# Patient Record
Sex: Female | Born: 1960 | Race: White | Hispanic: No | Smoking: Former smoker
Health system: Southern US, Community
[De-identification: ages and names within clinical notes are randomized; demographics above are authoritative.]

## PROBLEM LIST (undated history)

## (undated) DIAGNOSIS — T7840XA Allergy, unspecified, initial encounter: Secondary | ICD-10-CM

## (undated) DIAGNOSIS — L570 Actinic keratosis: Secondary | ICD-10-CM

## (undated) DIAGNOSIS — M1990 Unspecified osteoarthritis, unspecified site: Secondary | ICD-10-CM

## (undated) DIAGNOSIS — E119 Type 2 diabetes mellitus without complications: Secondary | ICD-10-CM

## (undated) DIAGNOSIS — G629 Polyneuropathy, unspecified: Secondary | ICD-10-CM

## (undated) HISTORY — DX: Polyneuropathy, unspecified: G62.9

## (undated) HISTORY — PX: BREAST SURGERY: SHX581

## (undated) HISTORY — DX: Type 2 diabetes mellitus without complications: E11.9

## (undated) HISTORY — PX: CHOLECYSTECTOMY: SHX55

## (undated) HISTORY — DX: Actinic keratosis: L57.0

## (undated) HISTORY — DX: Allergy, unspecified, initial encounter: T78.40XA

## (undated) HISTORY — DX: Unspecified osteoarthritis, unspecified site: M19.90

## (undated) HISTORY — PX: GALLBLADDER SURGERY: SHX652

---

## 1986-05-23 HISTORY — PX: REDUCTION MAMMAPLASTY: SUR839

## 2004-11-03 ENCOUNTER — Ambulatory Visit: Payer: Self-pay | Admitting: Obstetrics and Gynecology

## 2006-08-01 ENCOUNTER — Ambulatory Visit: Payer: Self-pay | Admitting: Gastroenterology

## 2006-08-01 ENCOUNTER — Ambulatory Visit: Payer: Self-pay | Admitting: Obstetrics and Gynecology

## 2006-08-09 ENCOUNTER — Ambulatory Visit: Payer: Self-pay | Admitting: Gastroenterology

## 2007-12-19 ENCOUNTER — Ambulatory Visit: Payer: Self-pay | Admitting: Obstetrics and Gynecology

## 2009-09-29 ENCOUNTER — Ambulatory Visit: Payer: Self-pay | Admitting: Obstetrics and Gynecology

## 2010-10-11 ENCOUNTER — Ambulatory Visit: Payer: Self-pay | Admitting: Obstetrics and Gynecology

## 2011-12-07 ENCOUNTER — Ambulatory Visit: Payer: Self-pay | Admitting: Obstetrics and Gynecology

## 2013-01-09 ENCOUNTER — Ambulatory Visit: Payer: Self-pay | Admitting: Obstetrics and Gynecology

## 2013-09-27 ENCOUNTER — Ambulatory Visit: Payer: BC Managed Care – PPO | Admitting: Podiatry

## 2013-09-27 ENCOUNTER — Ambulatory Visit (INDEPENDENT_AMBULATORY_CARE_PROVIDER_SITE_OTHER): Payer: BC Managed Care – PPO

## 2013-09-27 ENCOUNTER — Encounter: Payer: Self-pay | Admitting: Podiatry

## 2013-09-27 VITALS — BP 121/87 | HR 86 | Resp 16 | Ht 69.0 in | Wt 181.0 lb

## 2013-09-27 DIAGNOSIS — M79609 Pain in unspecified limb: Secondary | ICD-10-CM

## 2013-09-27 DIAGNOSIS — Z794 Long term (current) use of insulin: Principal | ICD-10-CM

## 2013-09-27 DIAGNOSIS — M069 Rheumatoid arthritis, unspecified: Secondary | ICD-10-CM

## 2013-09-27 DIAGNOSIS — M7750 Other enthesopathy of unspecified foot: Secondary | ICD-10-CM

## 2013-09-27 DIAGNOSIS — E119 Type 2 diabetes mellitus without complications: Secondary | ICD-10-CM

## 2013-09-27 MED ORDER — TRIAMCINOLONE ACETONIDE 10 MG/ML IJ SUSP
10.0000 mg | Freq: Once | INTRAMUSCULAR | Status: AC
  Administered 2013-09-27: 10 mg

## 2013-09-27 NOTE — Progress Notes (Signed)
   Subjective:    Patient ID: Pam Quinn, female    DOB: [birth year], 53 y.o.   MRN: [id number]  HPI Comments: Just wanted to have my inserts checked, see if  I need new ones. Feet have been feeling worse. Last A1c was 6.2      Review of Systems  Endocrine:       Diabetes   Allergic/Immunologic: Positive for environmental allergies.  Neurological: Positive for numbness.       Objective:   Physical Exam        Assessment & Plan:

## 2013-09-29 NOTE — Progress Notes (Signed)
Subjective:     Patient ID: Pam Quinn, female   DOB: 05-18-1961, 53 y.o.   MRN: [id number]  HPI patient presents stating I'm getting a lot of pain on top of my feet and my arches get tired and make it hard for me to walk distance. My arthritis has been relatively stable   Review of Systems  All other systems reviewed and are negative.      Objective:   Physical Exam  Nursing note and vitals reviewed. Constitutional: Pam Quinn is oriented to person, place, and time.  Cardiovascular: Intact distal pulses.   Musculoskeletal: Normal range of motion.  Neurological: Pam Quinn is oriented to person, place, and time.  Skin: Skin is warm.   neurovascular status intact with health history unchanged and range of motion subtalar midtarsal joint adequate. 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

## 2013-10-10 ENCOUNTER — Encounter: Payer: Self-pay | Admitting: *Deleted

## 2013-10-10 NOTE — Progress Notes (Signed)
Sent pt post card letting her know orthotics are here. 

## 2013-10-22 ENCOUNTER — Other Ambulatory Visit: Payer: BC Managed Care – PPO

## 2013-10-29 ENCOUNTER — Ambulatory Visit (INDEPENDENT_AMBULATORY_CARE_PROVIDER_SITE_OTHER): Payer: BC Managed Care – PPO | Admitting: *Deleted

## 2013-10-29 ENCOUNTER — Encounter: Payer: Self-pay | Admitting: *Deleted

## 2013-10-29 VITALS — BP 125/76 | HR 94 | Resp 16

## 2013-10-29 DIAGNOSIS — M7750 Other enthesopathy of unspecified foot: Secondary | ICD-10-CM

## 2013-10-29 NOTE — Progress Notes (Signed)
Pt presents today to pick up orthotics and went over wearing instructions.

## 2013-10-29 NOTE — Patient Instructions (Signed)

## 2014-04-25 ENCOUNTER — Ambulatory Visit (INDEPENDENT_AMBULATORY_CARE_PROVIDER_SITE_OTHER): Payer: BC Managed Care – PPO | Admitting: Podiatry

## 2014-04-25 VITALS — BP 147/91 | HR 75 | Resp 16

## 2014-04-25 DIAGNOSIS — M779 Enthesopathy, unspecified: Secondary | ICD-10-CM

## 2014-04-25 MED ORDER — TRIAMCINOLONE ACETONIDE 10 MG/ML IJ SUSP
10.0000 mg | Freq: Once | INTRAMUSCULAR | Status: AC
  Administered 2014-04-25: 10 mg

## 2014-04-27 NOTE — Progress Notes (Signed)
Subjective:     Patient ID: Pam Quinn, female   DOB: February 11, 1961, 53 y.o.   MRN: [id number]  HPI patient points stating she's getting pain on top of her feet bilateral and that she had about 5-6 months of relief   Review of Systems     Objective:   Physical Exam Neurovascular status intact with continued discomfort in the dorsum of both feet within the midtarsal joint consistent with arthritis    Assessment:     Tendinitis with probable deep inflammatory arthritis noted    Plan:     Reviewed home physical therapy and shoe gear appropriate usage for her and today injected the dorsal tendon complex of both feet 3 mg Kenalog 5 mg Xylocaine and advised on heat and ice therapy

## 2014-07-31 DIAGNOSIS — M7750 Other enthesopathy of unspecified foot: Secondary | ICD-10-CM

## 2014-09-05 ENCOUNTER — Ambulatory Visit (INDEPENDENT_AMBULATORY_CARE_PROVIDER_SITE_OTHER): Payer: BLUE CROSS/BLUE SHIELD | Admitting: Podiatry

## 2014-09-05 ENCOUNTER — Encounter: Payer: Self-pay | Admitting: Podiatry

## 2014-09-05 VITALS — BP 141/83 | HR 91 | Resp 16

## 2014-09-05 DIAGNOSIS — M779 Enthesopathy, unspecified: Secondary | ICD-10-CM | POA: Diagnosis not present

## 2014-09-08 NOTE — Progress Notes (Signed)
Subjective:     Patient ID: Pam Quinn, female   DOB: 12/30/1960, 54 y.o.   MRN: [id number]  HPI patient states that her feet still bother her at times and the orthotics are starting to lose some of their support and they do not fit all shoes   Review of Systems     Objective:   Physical Exam Neurovascular status intact muscle strength adequate with moderate depression of the arch and tendinitis-like symptoms    Assessment:     Tendinitis bilateral with structural deformity    Plan:     Advised on physical therapy anti-inflammatories and continued orthotic usage and scanned for new orthotics. Reappoint when ready

## 2014-09-10 ENCOUNTER — Other Ambulatory Visit: Payer: Self-pay | Admitting: Obstetrics and Gynecology

## 2014-09-10 DIAGNOSIS — Z1231 Encounter for screening mammogram for malignant neoplasm of breast: Secondary | ICD-10-CM

## 2014-10-08 ENCOUNTER — Ambulatory Visit: Payer: Self-pay

## 2014-10-24 ENCOUNTER — Ambulatory Visit (INDEPENDENT_AMBULATORY_CARE_PROVIDER_SITE_OTHER): Payer: BLUE CROSS/BLUE SHIELD | Admitting: Podiatry

## 2014-10-24 ENCOUNTER — Ambulatory Visit: Payer: BC Managed Care – PPO | Admitting: Podiatry

## 2014-10-24 VITALS — BP 129/80 | HR 82 | Resp 16

## 2014-10-24 DIAGNOSIS — M779 Enthesopathy, unspecified: Secondary | ICD-10-CM | POA: Diagnosis not present

## 2014-10-24 MED ORDER — TRIAMCINOLONE ACETONIDE 10 MG/ML IJ SUSP
10.0000 mg | Freq: Once | INTRAMUSCULAR | Status: AC
  Administered 2014-10-24: 10 mg

## 2014-10-24 NOTE — Patient Instructions (Signed)

## 2014-10-24 NOTE — Progress Notes (Signed)
Subjective:     Patient ID: Pam Quinn, female   DOB: 10-28-1960, 54 y.o.   MRN: [id number]  HPI patient states my father passed away so I was on my feet a lot and the tops have started to hurt me quite a bit   Review of Systems     Objective:   Physical Exam Neurovascular status intact with inflammation of the lateral side of the mid tarsal joint bilateral with inflammation and fluid buildup noted    Assessment:     Tendinitis bilateral with inflammation and fluid buildup    Plan:     Injected the lateral side of the midtarsal joint 3 mg Kenalog 5 mg Xylocaine and instructed on heat ice therapy and dispensed orthotics with instructions and reappoint as needed

## 2014-10-29 ENCOUNTER — Ambulatory Visit
Admission: RE | Admit: 2014-10-29 | Discharge: 2014-10-29 | Disposition: A | Discharge status: Home / Self Care | Payer: BLUE CROSS/BLUE SHIELD | Source: Ambulatory Visit | Attending: Obstetrics and Gynecology | Admitting: Obstetrics and Gynecology

## 2014-10-29 DIAGNOSIS — Z1231 Encounter for screening mammogram for malignant neoplasm of breast: Secondary | ICD-10-CM | POA: Diagnosis not present

## 2015-01-19 ENCOUNTER — Other Ambulatory Visit: Payer: Self-pay

## 2015-01-19 DIAGNOSIS — J449 Chronic obstructive pulmonary disease, unspecified: Secondary | ICD-10-CM

## 2015-01-19 DIAGNOSIS — I158 Other secondary hypertension: Secondary | ICD-10-CM

## 2015-01-19 DIAGNOSIS — R0602 Shortness of breath: Secondary | ICD-10-CM

## 2015-01-22 ENCOUNTER — Ambulatory Visit: Payer: BLUE CROSS/BLUE SHIELD

## 2015-01-22 DIAGNOSIS — R0602 Shortness of breath: Secondary | ICD-10-CM

## 2015-01-22 DIAGNOSIS — J449 Chronic obstructive pulmonary disease, unspecified: Secondary | ICD-10-CM

## 2015-01-22 DIAGNOSIS — I158 Other secondary hypertension: Secondary | ICD-10-CM | POA: Diagnosis not present

## 2015-03-05 ENCOUNTER — Ambulatory Visit (INDEPENDENT_AMBULATORY_CARE_PROVIDER_SITE_OTHER): Payer: BLUE CROSS/BLUE SHIELD

## 2015-03-05 ENCOUNTER — Encounter: Payer: Self-pay | Admitting: Podiatry

## 2015-03-05 ENCOUNTER — Ambulatory Visit (INDEPENDENT_AMBULATORY_CARE_PROVIDER_SITE_OTHER): Payer: BLUE CROSS/BLUE SHIELD | Admitting: Podiatry

## 2015-03-05 VITALS — BP 135/97 | HR 91 | Resp 16

## 2015-03-05 DIAGNOSIS — M779 Enthesopathy, unspecified: Secondary | ICD-10-CM

## 2015-03-05 DIAGNOSIS — M722 Plantar fascial fibromatosis: Secondary | ICD-10-CM

## 2015-03-05 MED ORDER — TRIAMCINOLONE ACETONIDE 10 MG/ML IJ SUSP
10.0000 mg | Freq: Once | INTRAMUSCULAR | Status: AC
  Administered 2015-03-05: 10 mg

## 2015-03-06 NOTE — Progress Notes (Signed)
Subjective:     Patient ID: Pam Quinn, female   DOB: Jan 12, 1961, 54 y.o.   MRN: [id number]  HPI patient states I'm getting pain on the top of both my feet that's not as intense but I want to try to catch it before it gets worse and I need new orthotics for my feet   Review of Systems     Objective:   Physical Exam Neurovascular status intact muscle strength adequate range of motion within normal limits with patient noted to have exquisite discomfort dorsal aspect midtarsal joint bilateral with inflammatory tendinitis noted    Assessment:     Tendinitis with inflammation of the dorsal midfoot area bilateral    Plan:     H&P and condition reviewed with patient and at this time I did injection treatment of the dorsal midfoot bilateral 3 mg Kenalog 5 mg Xylocaine and then scanned for custom orthotics to reduce plantar pressure against the feet

## 2015-03-26 ENCOUNTER — Ambulatory Visit: Payer: BLUE CROSS/BLUE SHIELD | Admitting: *Deleted

## 2015-03-26 DIAGNOSIS — M779 Enthesopathy, unspecified: Secondary | ICD-10-CM

## 2015-03-26 NOTE — Progress Notes (Signed)
Patient ID: Pam Quinn, female   DOB: [birth year], 54 y.o.   MRN: [id number] Patient presents for orthotic pick up.  Verbal and written break in and wear instructions given.  Patient will follow up in 4 weeks if symptoms worsen or fail to improve.

## 2015-03-26 NOTE — Patient Instructions (Signed)

## 2015-07-29 ENCOUNTER — Encounter: Payer: Self-pay | Admitting: Podiatry

## 2015-07-29 ENCOUNTER — Ambulatory Visit (INDEPENDENT_AMBULATORY_CARE_PROVIDER_SITE_OTHER): Payer: BLUE CROSS/BLUE SHIELD | Admitting: Podiatry

## 2015-07-29 DIAGNOSIS — M779 Enthesopathy, unspecified: Secondary | ICD-10-CM

## 2015-07-29 DIAGNOSIS — Z794 Long term (current) use of insulin: Secondary | ICD-10-CM

## 2015-07-29 DIAGNOSIS — E119 Type 2 diabetes mellitus without complications: Secondary | ICD-10-CM

## 2015-07-29 MED ORDER — TRIAMCINOLONE ACETONIDE 10 MG/ML IJ SUSP
10.0000 mg | Freq: Once | INTRAMUSCULAR | Status: AC
  Administered 2015-07-29: 10 mg

## 2015-07-30 NOTE — Progress Notes (Signed)
Subjective:     Patient ID: Pam Quinn, female   DOB: Sep 18, 1960, 55 y.o.   MRN: [id number]  HPI patient states that both midfoot areas are bothering me and making it hard to walk and I need medication but so far it's been doing a good job of controlling my symptoms   Review of Systems     Objective:   Physical Exam Neurovascular status intact muscle strength adequate with inflammation around the midtarsal joint bilateral with fluid buildup noted.    Assessment:     Tendinitis of the dorsal midfoot area bilateral    Plan:     Injected the dorsal midfoot area bilateral 3 mg Kenalog 5 mill grams Xylocaine and advised on heat therapy shoes that do not press against the area and the possibilities long-term for topical or other treatments for not able to continue to keep symptoms good

## 2015-12-01 ENCOUNTER — Encounter: Payer: Self-pay | Admitting: Podiatry

## 2015-12-01 NOTE — Progress Notes (Signed)
Mailed medical records today to the [employer] per request of attorney Danne Harbor. TFC JMS

## 2016-02-26 ENCOUNTER — Encounter: Payer: Self-pay | Admitting: Podiatry

## 2016-02-26 ENCOUNTER — Ambulatory Visit (INDEPENDENT_AMBULATORY_CARE_PROVIDER_SITE_OTHER): Payer: BLUE CROSS/BLUE SHIELD | Admitting: Podiatry

## 2016-02-26 ENCOUNTER — Ambulatory Visit (INDEPENDENT_AMBULATORY_CARE_PROVIDER_SITE_OTHER): Payer: BLUE CROSS/BLUE SHIELD

## 2016-02-26 VITALS — BP 165/98 | HR 81 | Resp 16 | Ht 68.0 in | Wt 185.0 lb

## 2016-02-26 DIAGNOSIS — M779 Enthesopathy, unspecified: Secondary | ICD-10-CM | POA: Diagnosis not present

## 2016-02-26 DIAGNOSIS — M79671 Pain in right foot: Secondary | ICD-10-CM

## 2016-02-26 DIAGNOSIS — M79672 Pain in left foot: Secondary | ICD-10-CM | POA: Diagnosis not present

## 2016-02-26 MED ORDER — TRIAMCINOLONE ACETONIDE 10 MG/ML IJ SUSP
10.0000 mg | Freq: Once | INTRAMUSCULAR | Status: AC
  Administered 2016-02-26: 10 mg

## 2016-02-28 NOTE — Progress Notes (Signed)
Subjective:     Patient ID: Pam Quinn, female   DOB: 1960/09/01, 55 y.o.   MRN: [id number]  HPI patient states that she still getting pain on top of her feet and she needs new orthotics   Review of Systems     Objective:   Physical Exam Neurovascular status intact with continued inflammation and pain of the dorsum of both feet with fluid buildup noted and depression of the arch    Assessment:     Tendinitis-like condition with pain around the dorsum of the metatarsals bilateral with fluid buildup    Plan:     H&P conditions reviewed and today dorsal injections administered 3 mg Kenalog 5 mg Xylocaine and advised on orthotics which were scanned for today

## 2016-03-18 ENCOUNTER — Ambulatory Visit (INDEPENDENT_AMBULATORY_CARE_PROVIDER_SITE_OTHER): Payer: BLUE CROSS/BLUE SHIELD | Admitting: Podiatry

## 2016-03-18 DIAGNOSIS — M779 Enthesopathy, unspecified: Secondary | ICD-10-CM

## 2016-03-18 NOTE — Patient Instructions (Signed)

## 2016-03-18 NOTE — Progress Notes (Signed)
Patient ID: Pam Quinn, female   DOB: Jun 04, 1960, 55 y.o.   MRN: [id number]  Patient presents for orthotic pick up.  Verbal and written break in and wear instructions given.  Patient will follow up in 4 weeks if symptoms worsen or fail to improve.

## 2016-03-23 ENCOUNTER — Other Ambulatory Visit: Payer: Self-pay | Admitting: Obstetrics and Gynecology

## 2016-03-23 DIAGNOSIS — Z1231 Encounter for screening mammogram for malignant neoplasm of breast: Secondary | ICD-10-CM

## 2016-04-28 ENCOUNTER — Ambulatory Visit: Payer: BLUE CROSS/BLUE SHIELD

## 2016-05-02 ENCOUNTER — Ambulatory Visit
Admission: RE | Admit: 2016-05-02 | Discharge: 2016-05-02 | Disposition: A | Discharge status: Home / Self Care | Payer: BLUE CROSS/BLUE SHIELD | Source: Ambulatory Visit | Attending: Obstetrics and Gynecology | Admitting: Obstetrics and Gynecology

## 2016-05-02 DIAGNOSIS — Z1231 Encounter for screening mammogram for malignant neoplasm of breast: Secondary | ICD-10-CM | POA: Insufficient documentation

## 2016-09-01 ENCOUNTER — Ambulatory Visit (INDEPENDENT_AMBULATORY_CARE_PROVIDER_SITE_OTHER): Payer: BLUE CROSS/BLUE SHIELD | Admitting: Podiatry

## 2016-09-01 ENCOUNTER — Encounter: Payer: Self-pay | Admitting: Podiatry

## 2016-09-01 DIAGNOSIS — M79672 Pain in left foot: Secondary | ICD-10-CM

## 2016-09-01 DIAGNOSIS — M779 Enthesopathy, unspecified: Secondary | ICD-10-CM | POA: Diagnosis not present

## 2016-09-01 DIAGNOSIS — M79671 Pain in right foot: Secondary | ICD-10-CM

## 2016-09-01 MED ORDER — TRIAMCINOLONE ACETONIDE 10 MG/ML IJ SUSP
10.0000 mg | Freq: Once | INTRAMUSCULAR | Status: AC
  Administered 2016-09-01: 10 mg

## 2016-09-04 NOTE — Progress Notes (Signed)
Subjective:     Patient ID: Pam Quinn, female   DOB: March 17, 1961, 56 y.o.   MRN: [id number]  HPI patient presents stating that she's had a lot of pain in the dorsum of both feet that occurred recently and she has inflammation of the tendon complex   Review of Systems     Objective:   Physical Exam Neurovascular status unchanged with tendinitis lateral side both feet with inflammation and fluid buildup with moderate depression of the arch    Assessment:     Inflammatory tendinitis bilateral    Plan:     H&P condition reviewed foot structure discussed. At this point I recommended new orthotics and patient is scanned and I did inject the dorsal lateral tendon complex bilateral 3 mg Kenalog 5 mill grams Xylocaine and advised on heat and ice therapy. Reappoint when orthotics are ready or earlier if needed

## 2016-09-23 ENCOUNTER — Ambulatory Visit (INDEPENDENT_AMBULATORY_CARE_PROVIDER_SITE_OTHER): Payer: Self-pay | Admitting: Podiatry

## 2016-09-23 DIAGNOSIS — M79671 Pain in right foot: Secondary | ICD-10-CM

## 2016-09-23 DIAGNOSIS — M79672 Pain in left foot: Secondary | ICD-10-CM

## 2016-09-23 NOTE — Progress Notes (Signed)
Patient ID: Pam Quinn, female   DOB: [birth year], 56 y.o.   MRN: [id number]   Patient presents today to pick up her custom molded orthotics. Break-in and care instructions were provided. All patient questions were answered. Return to clinic in 4 weeks for follow-up.  Felecia Shelling, DPM [facility]  Dr. Felecia Shelling, DPM    [address]                                        [place], [place] [address]                Office [phone]  Fax [phone]

## 2017-05-01 ENCOUNTER — Ambulatory Visit: Payer: BLUE CROSS/BLUE SHIELD | Admitting: Podiatry

## 2017-05-05 ENCOUNTER — Ambulatory Visit (INDEPENDENT_AMBULATORY_CARE_PROVIDER_SITE_OTHER): Payer: BLUE CROSS/BLUE SHIELD | Admitting: Podiatry

## 2017-05-05 ENCOUNTER — Encounter: Payer: Self-pay | Admitting: Podiatry

## 2017-05-05 ENCOUNTER — Ambulatory Visit (INDEPENDENT_AMBULATORY_CARE_PROVIDER_SITE_OTHER): Payer: BLUE CROSS/BLUE SHIELD

## 2017-05-05 DIAGNOSIS — M7661 Achilles tendinitis, right leg: Secondary | ICD-10-CM | POA: Diagnosis not present

## 2017-05-05 DIAGNOSIS — M7662 Achilles tendinitis, left leg: Secondary | ICD-10-CM

## 2017-05-05 DIAGNOSIS — M779 Enthesopathy, unspecified: Secondary | ICD-10-CM

## 2017-05-05 MED ORDER — TRIAMCINOLONE ACETONIDE 10 MG/ML IJ SUSP
10.0000 mg | Freq: Once | INTRAMUSCULAR | Status: AC
  Administered 2017-05-05: 10 mg

## 2017-05-06 NOTE — Progress Notes (Signed)
Subjective:   Patient ID: Pam Quinn, female   DOB: 56 y.o.   MRN: [id number]   HPI Patient states she is doing well with orthotics but she is having a lot of pain on top of both feet which is recurred and she did very well for around 6 months   ROS      Objective:  Physical Exam  Neurovascular status intact with inflammation tendon irritation of the dorsum of both feet with pain     Assessment:  Dorsal tendinitis bilateral with midfoot arthritis bilateral     Plan:  Injected the dorsal midfoot bilateral 3 mg Kenalog 5 mg Xylocaine into the tendinous complex and reappoint for routine care

## 2017-08-07 ENCOUNTER — Other Ambulatory Visit: Payer: Self-pay | Admitting: Family Medicine

## 2017-08-07 ENCOUNTER — Other Ambulatory Visit: Payer: Self-pay | Admitting: Obstetrics and Gynecology

## 2017-08-07 DIAGNOSIS — Z1231 Encounter for screening mammogram for malignant neoplasm of breast: Secondary | ICD-10-CM

## 2017-08-24 ENCOUNTER — Ambulatory Visit
Admission: RE | Admit: 2017-08-24 | Discharge: 2017-08-24 | Disposition: A | Discharge status: Home / Self Care | Payer: BLUE CROSS/BLUE SHIELD | Source: Ambulatory Visit | Attending: Family Medicine | Admitting: Family Medicine

## 2017-08-24 DIAGNOSIS — Z1231 Encounter for screening mammogram for malignant neoplasm of breast: Secondary | ICD-10-CM | POA: Insufficient documentation

## 2018-03-29 ENCOUNTER — Ambulatory Visit (INDEPENDENT_AMBULATORY_CARE_PROVIDER_SITE_OTHER): Payer: BLUE CROSS/BLUE SHIELD

## 2018-03-29 ENCOUNTER — Other Ambulatory Visit: Payer: Self-pay | Admitting: Podiatry

## 2018-03-29 ENCOUNTER — Ambulatory Visit: Payer: BLUE CROSS/BLUE SHIELD | Admitting: Podiatry

## 2018-03-29 ENCOUNTER — Encounter: Payer: Self-pay | Admitting: Podiatry

## 2018-03-29 DIAGNOSIS — M79672 Pain in left foot: Principal | ICD-10-CM

## 2018-03-29 DIAGNOSIS — M7751 Other enthesopathy of right foot: Secondary | ICD-10-CM

## 2018-03-29 DIAGNOSIS — M779 Enthesopathy, unspecified: Secondary | ICD-10-CM

## 2018-03-29 DIAGNOSIS — M7752 Other enthesopathy of left foot: Secondary | ICD-10-CM | POA: Diagnosis not present

## 2018-03-29 DIAGNOSIS — M7750 Other enthesopathy of unspecified foot: Secondary | ICD-10-CM

## 2018-03-29 DIAGNOSIS — M79671 Pain in right foot: Secondary | ICD-10-CM

## 2018-03-29 MED ORDER — TRIAMCINOLONE ACETONIDE 10 MG/ML IJ SUSP
10.0000 mg | Freq: Once | INTRAMUSCULAR | Status: AC
  Administered 2018-03-29: 10 mg

## 2018-03-29 NOTE — Progress Notes (Signed)
Subjective:   Patient ID: Pam Quinn, female   DOB: 57 y.o.   MRN: [id number]   HPI Patient presents with quite a bit of discomfort dorsum both feet stating that is become inflamed over the last month and knows she needs new orthotics   ROS      Objective:  Physical Exam  Neurovascular status intact with extensor tendinitis bilateral with inflammation of the dorsal tissue complex that is painful with moderate high arch foot structure     Assessment:  Acute extensor tendinitis bilateral with moderate cavus foot structure     Plan:  H&P x-ray reviewed and today careful dorsal extensor injections administered 3 mg Kenalog 5 oh Xylocaine advised on physical therapy and after the first year will have new pair orthotics made for appropriate arch relief  X-rays indicate there is moderate midtarsal joint arthritis with high cavus foot structure signed visit

## 2018-03-29 NOTE — Progress Notes (Signed)
Foot pain

## 2018-11-08 ENCOUNTER — Encounter: Payer: Self-pay | Admitting: Podiatry

## 2018-11-08 ENCOUNTER — Other Ambulatory Visit: Payer: Self-pay

## 2018-11-08 ENCOUNTER — Ambulatory Visit (INDEPENDENT_AMBULATORY_CARE_PROVIDER_SITE_OTHER): Payer: BLUE CROSS/BLUE SHIELD | Admitting: Podiatry

## 2018-11-08 DIAGNOSIS — M779 Enthesopathy, unspecified: Secondary | ICD-10-CM

## 2018-11-08 DIAGNOSIS — M778 Other enthesopathies, not elsewhere classified: Secondary | ICD-10-CM

## 2018-11-15 NOTE — Progress Notes (Signed)
Subjective:   Patient ID: Pam Quinn, female   DOB: 58 y.o.   MRN: [id number]   HPI Patient presents she has been getting a lot of pain on top of both feet and it is making it difficult for her to be ambulatory   ROS      Objective:  Physical Exam  Acute discomfort dorsal aspect both feet in the extensor complex     Assessment:  Acute extensor tendinitis bilateral     Plan:  Reviewed condition did sterile prep and injected the tendon complex bilateral 3 mg Kenalog 5 mg Xylocaine and begin topical medicines.  Reappoint to recheck

## 2019-02-22 ENCOUNTER — Other Ambulatory Visit: Payer: Self-pay | Admitting: Family Medicine

## 2019-02-22 DIAGNOSIS — Z1231 Encounter for screening mammogram for malignant neoplasm of breast: Secondary | ICD-10-CM

## 2019-12-23 ENCOUNTER — Other Ambulatory Visit: Payer: Self-pay

## 2019-12-23 ENCOUNTER — Ambulatory Visit: Payer: BLUE CROSS/BLUE SHIELD | Admitting: Podiatry

## 2020-02-10 ENCOUNTER — Other Ambulatory Visit: Payer: Self-pay

## 2020-02-10 ENCOUNTER — Ambulatory Visit: Payer: BLUE CROSS/BLUE SHIELD | Admitting: Dermatology

## 2021-06-07 ENCOUNTER — Emergency Department

## 2021-06-07 ENCOUNTER — Observation Stay
Admission: EM | Admit: 2021-06-07 | Discharge: 2021-06-08 | Disposition: A | Discharge status: Home / Self Care | Attending: Internal Medicine | Admitting: Internal Medicine

## 2021-06-07 ENCOUNTER — Other Ambulatory Visit: Payer: Self-pay

## 2021-06-07 DIAGNOSIS — Z20822 Contact with and (suspected) exposure to covid-19: Secondary | ICD-10-CM | POA: Insufficient documentation

## 2021-06-07 DIAGNOSIS — R531 Weakness: Secondary | ICD-10-CM | POA: Diagnosis not present

## 2021-06-07 DIAGNOSIS — Z7982 Long term (current) use of aspirin: Secondary | ICD-10-CM | POA: Diagnosis not present

## 2021-06-07 DIAGNOSIS — R634 Abnormal weight loss: Secondary | ICD-10-CM | POA: Diagnosis present

## 2021-06-07 DIAGNOSIS — K5900 Constipation, unspecified: Secondary | ICD-10-CM | POA: Diagnosis not present

## 2021-06-07 DIAGNOSIS — I10 Essential (primary) hypertension: Secondary | ICD-10-CM

## 2021-06-07 DIAGNOSIS — Z7984 Long term (current) use of oral hypoglycemic drugs: Secondary | ICD-10-CM | POA: Diagnosis not present

## 2021-06-07 DIAGNOSIS — Z87891 Personal history of nicotine dependence: Secondary | ICD-10-CM | POA: Insufficient documentation

## 2021-06-07 DIAGNOSIS — R1110 Vomiting, unspecified: Secondary | ICD-10-CM | POA: Diagnosis present

## 2021-06-07 DIAGNOSIS — Z79899 Other long term (current) drug therapy: Secondary | ICD-10-CM | POA: Diagnosis not present

## 2021-06-07 DIAGNOSIS — G4700 Insomnia, unspecified: Secondary | ICD-10-CM

## 2021-06-07 DIAGNOSIS — E1122 Type 2 diabetes mellitus with diabetic chronic kidney disease: Secondary | ICD-10-CM | POA: Insufficient documentation

## 2021-06-07 DIAGNOSIS — N1831 Chronic kidney disease, stage 3a: Secondary | ICD-10-CM | POA: Diagnosis not present

## 2021-06-07 DIAGNOSIS — I959 Hypotension, unspecified: Secondary | ICD-10-CM

## 2021-06-07 DIAGNOSIS — E872 Acidosis, unspecified: Secondary | ICD-10-CM | POA: Diagnosis present

## 2021-06-07 DIAGNOSIS — R0602 Shortness of breath: Secondary | ICD-10-CM | POA: Diagnosis not present

## 2021-06-07 DIAGNOSIS — G629 Polyneuropathy, unspecified: Secondary | ICD-10-CM

## 2021-06-07 DIAGNOSIS — R42 Dizziness and giddiness: Secondary | ICD-10-CM | POA: Diagnosis present

## 2021-06-07 DIAGNOSIS — E119 Type 2 diabetes mellitus without complications: Secondary | ICD-10-CM

## 2021-06-07 DIAGNOSIS — F32A Depression, unspecified: Secondary | ICD-10-CM

## 2021-06-07 DIAGNOSIS — I129 Hypertensive chronic kidney disease with stage 1 through stage 4 chronic kidney disease, or unspecified chronic kidney disease: Secondary | ICD-10-CM | POA: Insufficient documentation

## 2021-06-07 LAB — CBC
HCT: 40.8 % (ref 36.0–46.0)
Hemoglobin: 13.3 g/dL (ref 12.0–15.0)
MCH: 28.4 pg (ref 26.0–34.0)
MCHC: 32.6 g/dL (ref 30.0–36.0)
MCV: 87 fL (ref 80.0–100.0)
Platelets: 318 10*3/uL (ref 150–400)
RBC: 4.69 MIL/uL (ref 3.87–5.11)
RDW: 13.3 % (ref 11.5–15.5)
WBC: 8.1 10*3/uL (ref 4.0–10.5)
nRBC: 0 % (ref 0.0–0.2)

## 2021-06-07 LAB — URINALYSIS, ROUTINE W REFLEX MICROSCOPIC
Bilirubin Urine: NEGATIVE
Glucose, UA: NEGATIVE mg/dL
Hgb urine dipstick: NEGATIVE
Ketones, ur: NEGATIVE mg/dL
Leukocytes,Ua: NEGATIVE
Nitrite: NEGATIVE
Protein, ur: NEGATIVE mg/dL
Specific Gravity, Urine: 1.023 (ref 1.005–1.030)
pH: 6 (ref 5.0–8.0)

## 2021-06-07 LAB — HEPATIC FUNCTION PANEL
ALT: 22 U/L (ref 0–44)
AST: 27 U/L (ref 15–41)
Albumin: 4.5 g/dL (ref 3.5–5.0)
Alkaline Phosphatase: 54 U/L (ref 38–126)
Bilirubin, Direct: <0.1 mg/dL (ref 0.0–0.2)
Total Bilirubin: 0.6 mg/dL (ref 0.3–1.2)
Total Protein: 7.5 g/dL (ref 6.5–8.1)

## 2021-06-07 LAB — LACTIC ACID, PLASMA
Lactic Acid, Venous: 4.7 mmol/L (ref 0.5–1.9)
Lactic Acid, Venous: 2.1 mmol/L (ref 0.5–1.9)

## 2021-06-07 LAB — PROCALCITONIN: Procalcitonin: <0.1 ng/mL

## 2021-06-07 LAB — T4, FREE: Free T4: 0.91 ng/dL (ref 0.61–1.12)

## 2021-06-07 LAB — GLUCOSE, CAPILLARY: Glucose-Capillary: 146 mg/dL — ABNORMAL HIGH (ref 70–99)

## 2021-06-07 LAB — RESP PANEL BY RT-PCR (FLU A&B, COVID) ARPGX2
Influenza A by PCR: NEGATIVE
Influenza B by PCR: NEGATIVE
SARS Coronavirus 2 by RT PCR: NEGATIVE

## 2021-06-07 LAB — BASIC METABOLIC PANEL
Anion gap: 14 (ref 5–15)
BUN: 23 mg/dL — ABNORMAL HIGH (ref 6–20)
CO2: 20 mmol/L — ABNORMAL LOW (ref 22–32)
Calcium: 9.5 mg/dL (ref 8.9–10.3)
Chloride: 104 mmol/L (ref 98–111)
Creatinine, Ser: 1.23 mg/dL — ABNORMAL HIGH (ref 0.44–1.00)
GFR, Estimated: 50 mL/min — ABNORMAL LOW (ref >60)
Glucose, Bld: 111 mg/dL — ABNORMAL HIGH (ref 70–99)
Potassium: 4.1 mmol/L (ref 3.5–5.1)
Sodium: 138 mmol/L (ref 135–145)

## 2021-06-07 LAB — TSH: TSH: 5.028 u[IU]/mL — ABNORMAL HIGH (ref 0.350–4.500)

## 2021-06-07 LAB — MAGNESIUM: Magnesium: 1.3 mg/dL — ABNORMAL LOW (ref 1.7–2.4)

## 2021-06-07 LAB — TROPONIN I (HIGH SENSITIVITY)
Troponin I (High Sensitivity): <2 ng/L (ref <18)
Troponin I (High Sensitivity): <2 ng/L (ref <18)

## 2021-06-07 LAB — BRAIN NATRIURETIC PEPTIDE: B Natriuretic Peptide: 17.1 pg/mL (ref 0.0–100.0)

## 2021-06-07 MED ORDER — SODIUM CHLORIDE 0.9 % IV BOLUS
1000.0000 mL | Freq: Once | INTRAVENOUS | Status: AC
  Administered 2021-06-07: 1000 mL via INTRAVENOUS

## 2021-06-07 MED ORDER — MAGNESIUM SULFATE 2 GM/50ML IV SOLN
2.0000 g | Freq: Once | INTRAVENOUS | Status: AC
  Administered 2021-06-07: 2 g via INTRAVENOUS
  Filled 2021-06-07: qty 50

## 2021-06-07 MED ORDER — METRONIDAZOLE 500 MG/100ML IV SOLN
500.0000 mg | Freq: Once | INTRAVENOUS | Status: AC
  Administered 2021-06-07: 500 mg via INTRAVENOUS
  Filled 2021-06-07: qty 100

## 2021-06-07 MED ORDER — ACETAMINOPHEN 650 MG RE SUPP: 650.0000 mg | Freq: Four times a day (QID) | RECTAL | Status: DC | PRN

## 2021-06-07 MED ORDER — MELATONIN 5 MG PO TABS
15.0000 mg | ORAL_TABLET | Freq: Every day | ORAL | Status: DC
Start: 2021-06-07 — End: 2021-06-08
  Administered 2021-06-07: 15 mg via ORAL
  Filled 2021-06-07: qty 3

## 2021-06-07 MED ORDER — LISINOPRIL 10 MG PO TABS: 5.0000 mg | ORAL_TABLET | Freq: Every day | ORAL | Status: DC

## 2021-06-07 MED ORDER — ENOXAPARIN SODIUM 40 MG/0.4ML IJ SOSY
40.0000 mg | PREFILLED_SYRINGE | INTRAMUSCULAR | Status: DC
  Administered 2021-06-07: 40 mg via SUBCUTANEOUS
  Filled 2021-06-07: qty 0.4

## 2021-06-07 MED ORDER — ALBUTEROL SULFATE HFA 108 (90 BASE) MCG/ACT IN AERS: 2.0000 | INHALATION_SPRAY | RESPIRATORY_TRACT | Status: DC | PRN

## 2021-06-07 MED ORDER — ONDANSETRON HCL 4 MG/2ML IJ SOLN: 4.0000 mg | Freq: Four times a day (QID) | INTRAMUSCULAR | Status: DC | PRN

## 2021-06-07 MED ORDER — INSULIN ASPART 100 UNIT/ML IJ SOLN
0.0000 [IU] | Freq: Three times a day (TID) | INTRAMUSCULAR | Status: DC
  Administered 2021-06-08: 2 [IU] via SUBCUTANEOUS
  Administered 2021-06-08: 3 [IU] via SUBCUTANEOUS
  Filled 2021-06-07 (×2): qty 1

## 2021-06-07 MED ORDER — ESCITALOPRAM OXALATE 10 MG PO TABS
10.0000 mg | ORAL_TABLET | Freq: Every day | ORAL | Status: DC
  Administered 2021-06-08: 10 mg via ORAL
  Filled 2021-06-07: qty 1

## 2021-06-07 MED ORDER — LACTATED RINGERS IV BOLUS
500.0000 mL | Freq: Once | INTRAVENOUS | Status: AC
  Administered 2021-06-07: 500 mL via INTRAVENOUS

## 2021-06-07 MED ORDER — ONDANSETRON HCL 4 MG/2ML IJ SOLN
4.0000 mg | Freq: Once | INTRAMUSCULAR | Status: AC
Start: 2021-06-07 — End: 2021-06-07

## 2021-06-07 MED ORDER — IOHEXOL 300 MG/ML  SOLN
100.0000 mL | Freq: Once | INTRAMUSCULAR | Status: AC | PRN
  Administered 2021-06-07: 100 mL via INTRAVENOUS

## 2021-06-07 MED ORDER — HYDRALAZINE HCL 20 MG/ML IJ SOLN: 5.0000 mg | Freq: Four times a day (QID) | INTRAMUSCULAR | Status: DC | PRN

## 2021-06-07 MED ORDER — TRAZODONE HCL 100 MG PO TABS
100.0000 mg | ORAL_TABLET | Freq: Every day | ORAL | Status: DC
Start: 2021-06-07 — End: 2021-06-08
  Administered 2021-06-07: 100 mg via ORAL
  Filled 2021-06-07: qty 1

## 2021-06-07 MED ORDER — INSULIN ASPART 100 UNIT/ML IJ SOLN: 0.0000 [IU] | Freq: Every day | INTRAMUSCULAR | Status: DC

## 2021-06-07 MED ORDER — ONDANSETRON HCL 4 MG/2ML IJ SOLN
INTRAMUSCULAR | Status: AC
  Administered 2021-06-07: 4 mg via INTRAVENOUS
  Filled 2021-06-07: qty 2

## 2021-06-07 MED ORDER — SODIUM CHLORIDE 0.9 % IV SOLN
2.0000 g | Freq: Once | INTRAVENOUS | Status: AC
  Administered 2021-06-07: 2 g via INTRAVENOUS
  Filled 2021-06-07: qty 2

## 2021-06-07 MED ORDER — ALBUTEROL SULFATE (2.5 MG/3ML) 0.083% IN NEBU: 2.5000 mg | INHALATION_SOLUTION | RESPIRATORY_TRACT | Status: DC | PRN

## 2021-06-07 MED ORDER — ONDANSETRON HCL 4 MG PO TABS: 4.0000 mg | ORAL_TABLET | Freq: Four times a day (QID) | ORAL | Status: DC | PRN

## 2021-06-07 MED ORDER — VANCOMYCIN HCL IN DEXTROSE 1-5 GM/200ML-% IV SOLN
1000.0000 mg | Freq: Once | INTRAVENOUS | Status: AC
  Administered 2021-06-07: 1000 mg via INTRAVENOUS
  Filled 2021-06-07: qty 200

## 2021-06-07 MED ORDER — ACETAMINOPHEN 325 MG PO TABS: 650.0000 mg | ORAL_TABLET | Freq: Four times a day (QID) | ORAL | Status: DC | PRN

## 2021-06-07 NOTE — Assessment & Plan Note (Signed)
-   Presumed secondary to new antiglycemic agents - Recommend follow-up with PCP outpatient

## 2021-06-07 NOTE — ED Provider Notes (Signed)
Patient was signed out to me at 3 PM by Dr. Erma HeritageIsaacs.  In brief she is a 61 year old female who presented with subacute lightheadedness fatigue and poor p.o. intake.  She was notably hypotensive on arrival with a lactate of 4.7.  Responded well to IV fluids.  Repeat lactate 2.1.  Blood work overall is otherwise reassuring mildly elevated creatinine at 1.23 negative troponin.  Her hepatic function panel is unremarkable.  Her UA is negative chest x-ray does not show any obvious infiltrate and a CT abdomen and pelvis also does not show anything acute.  Unclear why she was still hypertensive, suspect this could just be in the setting of dehydration.  Sepsis certainly still possible.  Given the lactate and her blood pressure she was covered with broad-spectrum antibiotics and received fluids.  Will admit for observation to the hospital service.   Georga HackingMcHugh, Amil Bouwman Rose, MD 06/07/21 1806

## 2021-06-07 NOTE — Assessment & Plan Note (Signed)
-   Gabapentin has not been ordered - Check magnesium level, B12

## 2021-06-07 NOTE — H&P (Signed)
History and Physical   Pam Quinn [id number] DOB: 25-Oct-1960 DOA: 06/07/2021  PCP: Pam Body, MD  Outpatient Specialists: Dr. Gabriel Quinn, endocrinology Patient coming from: Clinic  I have personally briefly reviewed patient's old medical records in [place].  Chief Concern: Weakness and dizziness  HPI: Ms. Pam Quinn is a 61 year old female with medical history of hypertension, non-insulin-dependent diabetes mellitus type 2, neuropathy, insomnia, depression/anxiety, who presents emergency department for chief concerns of weakness and dizziness for several weeks.  In the emergency department patient had 1 emesis, nonbloody, episode.  Initial vitals in the emergency department showed temperature of 97.6, respiration rate of 14, heart rate of 75, initial blood pressure 95/68 and improved to 162/89, respiration SPO2 was 99% on room air.  Labs in the emergency department revealed serum sodium 138, potassium 4.1, chloride 104, bicarb of 20, BUN of 25, serum creatinine of 1.23, nonfasting glucose 111, WBC 8.1, hemoglobin 13.3, platelets of 318, GFR of 50, TSH 5.028, lactic acid was initially elevated at 4.7 and improved to 2.1.  COVID/influenza A/influenza B PCR were negative.  High sensitive troponin was negative x2.  Blood cultures x2 were collected and are in process.  Patient was given LR 500 mL bolus, sodium chloride 1 L bolus, broad-spectrum antibiotic with cefepime 2 g IV once, Flagyl 500 mg IV once, and vancomycin 1 g IV once per pharmacy.  At bedside she is able to tell me her name, her age, identify her daughter by name at bedside, and knows she is in the hospital and the current calendar year.  She states that she was in the outpatient neurologist office, Dr. Melrose Quinn waiting to be evaluated when she developed clamminess, dizziness and nausea. The RN/MA at Dr. Lannie Quinn office had difficulty getting her BP reading and she was placed in a wheelchair and wheeled  to the [facility] ED.  She had a hamburger and she vomited off white and some hamburger products.  She denies fever, chest pain in the last two weeks. She endorses shortness of breath with exertion.   She feels better at bedside. She reports she has never felt this way before. She states that for the last two weeks she has been feeling dizzy and off balance but never to this degree.  Two months ago she could walk upstairs felt mild exercise exertion and in the last two weeks, she feels extremely short of breath. Yesterday, her daughter had to help her hang cloths in the closet because her arms became so weak and she felt so tired.   She has IBS and gastroparesis at baseline so she has abdominal discomfort at baseline, without diarrhea symptoms.  She states she has to take a laxative once a week to have a bowel movement.  She denies dysuria, hematuria. She had diarrhea 3 days ago, and that has resolved.  She denies syncope, lost of consciousness, or weight gain. She endorses loosing approximately 18 pounds in the last two months. She states this is unintentional and she started [facility] about 3 months ago due to difficulty getting Trulicity.   Social history: She lives with her daughter, Pam Quinn at home. She denies tobacco, etoh, recreational drug use. She is a Agricultural engineer.   ROS: Constitutional: no weight change, no fever ENT/Mouth: no sore throat, no rhinorrhea Eyes: no eye pain, no vision changes Cardiovascular: no chest pain, no dyspnea,  no edema, no palpitations Respiratory: no cough, no sputum, no wheezing Gastrointestinal: no nausea, no vomiting, no diarrhea, no constipation Genitourinary: no urinary  incontinence, no dysuria, no hematuria Musculoskeletal: no arthralgias, no myalgias Skin: no skin lesions, no pruritus, Neuro: + weakness, no loss of consciousness, no syncope Psych: no anxiety, no depression, + decrease appetite Heme/Lymph: no bruising, no bleeding  ED Course: Discussed with  emergency medicine provider, patient felt to require hospitalization due to profound weakness in setting of elevated lactic acid.  Assessment/Plan  Principal Problem:   Weakness Active Problems:   Essential hypertension   Depression   Insomnia   Neuropathy   Emesis   Stage 3a chronic kidney disease (CKD) (HCC)   Diabetes mellitus type 2, noninsulin dependent (HCC)   Lactic acid increased   Constipation   Hypomagnesemia   Unintentional weight loss   * Weakness Assessment & Plan - Etiology work-up in progress - Patient had increased lactic acid, no elevated WBC and UA was negative for leukocytes and nitrates - Patient was treated for sepsis with bolus fluid and broad-spectrum antibiotic: Sodium chloride 1 L bolus, lactated ringer 500 mL bolus, Flagyl 500 mg IV once, vancomycin 1 g IV once, cefepime 2 g IV once per EDP. - Serum high-sensitivity troponin was negative x2, COVID/influenza A/influenza B PCR were negative - TSH was elevated at 5.028, check free T4 for hypo versus subclinical acquired hypothyroidism - Given that patient is on metformin, B12 deficiency cannot be excluded at this time, check B12 - Check magnesium, procalcitonin, BNP - Blood cultures x2 have been collected and are in process - Admit to telemetry medical, observation  Cardiovascular and Mediastinum Essential hypertension Assessment & Plan - Given the patient had initial lactic acid elevation and patient was treated for sepsis - Lisinopril 5 mg daily tablet has been resumed for 06/08/2020 - Hydralazine injection 5 mg IV every 6 hours as needed for SBP greater than 180, 3 doses ordered - Heart healthy/carb modified diet  Digestive Emesis Assessment & Plan - Query gastroenteritis - As needed ondansetron p.o. and IV for nausea and vomiting ordered  Endocrine Diabetes mellitus type 2, noninsulin dependent ([place]) Assessment & Plan - Metformin 1000 mg p.o. twice daily has been held at this time due to  increased lactic acid on ED presentation - Insulin SSI with at bedtime coverage ordered - Goal inpatient blood glucose level is 140-180 - Heart healthy/carb modified diet ordered  Nervous and Auditory Neuropathy Assessment & Plan - Gabapentin has not been ordered - Check magnesium level, B12  Genitourinary Stage 3a chronic kidney disease (CKD) (HCC) Assessment & Plan - CKD 3 AA is at baseline - Serum creatinine on presentation is 1.23/GFR 50 - Baseline serum creatinine is 0.9/GFR 64-1.0/GFR 51 - BMP in the a.m.  Other Unintentional weight loss Assessment & Plan - Presumed secondary to new antiglycemic agents - Recommend follow-up with PCP outpatient  Hypomagnesemia Assessment & Plan - Magnesium 2 g IV infusion ordered over 120 minutes - Magnesium level rechecked in the a.m. ordered  Constipation Assessment & Plan - Patient takes regimen of laxative in order to have a bowel movement and she is due for her medication on 06/08/2021 - I have offered to resume the Celexa this however she declined stating that she would rather have her weekly bowel movements at home if she gets discharged tomorrow and/or the following day  Lactic acid increased Assessment & Plan - Initial lactic acid level with 4.7 and improved to 2.1 - Patient did not meet sepsis criteria - 2 sets of blood cultures have been ordered and are in process per EDP work-up  Insomnia Assessment &  Plan - Patient takes trazodone 100 mg nightly this has been resumed - Resumed melatonin nightly  Depression Assessment & Plan - Patient takes escitalopram 10 mg daily, resumed - Trazodone 100 mg p.o. nightly resumed   Chart reviewed.   Outpatient PCP visits on 06/01/2021: Patient presented for bilateral hand numbness and balance problems.  Patient was referred to outpatient neurology.  DVT prophylaxis: Enoxaparin 40 mg subcutaneous every 24 hours Code Status: Full code Diet: Heart healthy/carb modified Family  Communication: Updated daughter at bedside with patient's permission  Disposition Plan: Pending clinical course anticipate less than 2 night stay Consults called: None at this time Admission status: Telemetry medical, observation  Past Medical History:  Diagnosis Date   Allergy    Arthritis    Diabetes mellitus without complication ([place])    Neuropathy    Past Surgical History:  Procedure Laterality Date   BREAST SURGERY     CESAREAN SECTION     GALLBLADDER SURGERY     REDUCTION MAMMAPLASTY Bilateral 1988   approximate date   Social History:  reports that she has quit smoking. She has never used smokeless tobacco. She reports current alcohol use. She reports that she does not use drugs.  Allergies  Allergen Reactions   Statins Other (See Comments)    Joint pain   Family History  Problem Relation Age of Onset   Lung cancer Father 45   Breast cancer Cousin 36       2nd cousin   Family history: Family history reviewed and not pertinent.  Prior to Admission medications   Medication Sig Start Date End Date Taking? Authorizing Provider  aspirin 81 MG EC tablet Take 81 mg by mouth 4 (four) times a week.   Yes [provider]  escitalopram (LEXAPRO) 10 MG tablet Take 10 mg by mouth daily. 01/25/21  Yes [provider]  gabapentin (NEURONTIN) 300 MG capsule Take 300-600 mg by mouth 2 (two) times daily. Take 300 mg every morning and 600 mg at bedtime 07/25/13  Yes [provider]  lisinopril (ZESTRIL) 5 MG tablet Take 5 mg by mouth daily. 05/28/21  Yes [provider]  melatonin 3 MG TABS tablet Take 15 mg by mouth at bedtime.   Yes [provider]  metFORMIN (GLUCOPHAGE) 500 MG tablet Take 1,000 mg by mouth 2 (two) times daily. 03/29/21  Yes [provider]  Multiple Vitamins-Minerals (MULTIVITAMIN WITH MINERALS) tablet Take 1 tablet by mouth daily.   Yes [provider]  NOVOLOG MIX 70/30 FLEXPEN (70-30) 100 UNIT/ML Pen 10-20  Units 2 (two) times daily with a meal. 08/09/13  Yes [provider]  traZODone (DESYREL) 100 MG tablet Take 100 mg by mouth at bedtime.   Yes [provider]  albuterol (VENTOLIN HFA) 108 (90 Base) MCG/ACT inhaler Inhale 2 puffs into the lungs every 4 (four) hours as needed for wheezing or shortness of breath. 03/04/21   [provider]  MOUNJARO 5 MG/0.5ML Pen Inject 5 mg into the skin once a week. Wednesday 06/02/21   [provider]  ONE TOUCH ULTRA TEST test strip  07/18/13   [provider]   Physical Exam: Vitals:   06/07/21 1730 06/07/21 1936 06/07/21 2010 06/07/21 2029  BP: (!) 162/89 138/79  128/74  Pulse: 87 83  81  Resp: 18 16  16   Temp:  98.1 F (36.7 C)  98.2 F (36.8 C)  TempSrc:  Oral    SpO2: 93% 98%  97%  Weight:  89.8 kg 88.4 kg  Height:   5\' 7"  (1.702 m) 5' 7.01" (1.702 m)   Constitutional: appears age-appropriate, frail, NAD, calm, comfortable Eyes: PERRL, lids and conjunctivae normal ENMT: Mucous membranes are moist. Posterior pharynx clear of any exudate or lesions. Age-appropriate dentition. Hearing appropriate Neck: normal, supple, no masses, no thyromegaly Respiratory: clear to auscultation bilaterally, no wheezing, no crackles. Normal respiratory effort. No accessory muscle use.  Cardiovascular: Regular rate and rhythm, no murmurs / rubs / gallops. No extremity edema. 2+ pedal pulses. No carotid bruits.  Abdomen: Obese abdomen, no tenderness, no masses palpated, no hepatosplenomegaly. Bowel sounds positive.  Musculoskeletal: no clubbing / cyanosis. No joint deformity upper and lower extremities. Good ROM, no contractures, no atrophy. Normal muscle tone.  Skin: no rashes, lesions, ulcers. No induration Neurologic: Sensation intact. Strength 5/5 in all 4.  Psychiatric: Normal judgment and insight. Alert and oriented x 3. Normal mood.   EKG: independently reviewed, showing sinus rhythm with rate of 75, QTc 480  Chest  x-ray on Admission: I personally reviewed and I agree with radiologist reading as below.  DG Chest 1 View  Result Date: 06/07/2021 CLINICAL DATA:  Shortness of breath, dizziness for 2 weeks EXAM: CHEST  1 VIEW COMPARISON:  None. FINDINGS: The cardiomediastinal silhouette is normal. There is no focal consolidation or pulmonary edema. There is no pleural effusion or pneumothorax. There is no acute osseous abnormality. IMPRESSION: No radiographic evidence of acute cardiopulmonary process. Electronically Signed   By: Valetta Mole M.D.   On: 06/07/2021 15:09   CT HEAD WO CONTRAST (5MM)  Result Date: 06/07/2021 CLINICAL DATA:  Altered mental status. EXAM: CT HEAD WITHOUT CONTRAST TECHNIQUE: Contiguous axial images were obtained from the base of the skull through the vertex without intravenous contrast. RADIATION DOSE REDUCTION: This exam was performed according to the departmental dose-optimization program which includes automated exposure control, adjustment of the mA and/or kV according to patient size and/or use of iterative reconstruction technique. COMPARISON:  None. FINDINGS: Brain: No evidence of acute infarction, hemorrhage, hydrocephalus, extra-axial collection or mass lesion/mass effect. Vascular: Atherosclerotic vascular calcification of the carotid siphons. No hyperdense vessel. Skull: Normal. Negative for fracture or focal lesion. Sinuses/Orbits: No acute finding. Other: None. IMPRESSION: 1. No acute intracranial abnormality. Electronically Signed   By: Titus Dubin M.D.   On: 06/07/2021 15:20   CT ABDOMEN PELVIS W CONTRAST  Result Date: 06/07/2021 CLINICAL DATA:  Abdominal pain EXAM: CT ABDOMEN AND PELVIS WITH CONTRAST TECHNIQUE: Multidetector CT imaging of the abdomen and pelvis was performed using the standard protocol following bolus administration of intravenous contrast. RADIATION DOSE REDUCTION: This exam was performed according to the departmental dose-optimization program which  includes automated exposure control, adjustment of the mA and/or kV according to patient size and/or use of iterative reconstruction technique. CONTRAST:  158mL OMNIPAQUE IOHEXOL 300 MG/ML  SOLN COMPARISON:  None. FINDINGS: Lower chest: No acute abnormality. Hepatobiliary: Liver is normal in size and contour. 2.4 cm hypodensity adjacent to the gallbladder fossa which likely represents focal fatty infiltration. Gallbladder is surgically absent. No biliary ductal dilatation identified. Pancreas: Mildly atrophic with no suspicious mass, inflammatory changes or ductal dilatation identified. Spleen: Normal in size without focal abnormality. Adrenals/Urinary Tract: Adrenal glands are within normal limits. Kidneys appear normal with no hydronephrosis. Mild circumferential urinary bladder wall thickening. Stomach/Bowel: No bowel obstruction, free air or pneumatosis. No bowel wall edema identified. Large amount of retained fecal material throughout the colon. No evidence of acute appendicitis. Vascular/Lymphatic: Aortic atherosclerosis. No  enlarged abdominal or pelvic lymph nodes. Reproductive: Uterus and bilateral adnexa are unremarkable. Other: No abdominal wall hernia or abnormality. No abdominopelvic ascites. Musculoskeletal: No acute or significant osseous findings. IMPRESSION: 1. Mild urinary bladder wall thickening, correlate with urinalysis. 2. Large amount of retained fecal material throughout the colon. 3. 2.4 cm hepatic hypodensity adjacent to the gallbladder fossa, likely represents focal fatty infiltration. Consider nonemergent follow-up with ultrasound or MRI. Electronically Signed   By: Ofilia Neas M.D.   On: 06/07/2021 15:25    Labs on Admission: I have personally reviewed following labs  CBC: Recent Labs  Lab 06/07/21 1420  WBC 8.1  HGB 13.3  HCT 40.8  MCV 87.0  PLT 0000000   Basic Metabolic Panel: Recent Labs  Lab 06/07/21 1420 06/07/21 2058  NA 138  --   K 4.1  --   CL 104  --   CO2  20*  --   GLUCOSE 111*  --   BUN 23*  --   CREATININE 1.23*  --   CALCIUM 9.5  --   MG  --  1.3*   GFR: Estimated Creatinine Clearance: 55.5 mL/min (A) (by C-G formula based on SCr of 1.23 mg/dL (H)).  Liver Function Tests: Recent Labs  Lab 06/07/21 1442  AST 27  ALT 22  ALKPHOS 54  BILITOT 0.6  PROT 7.5  ALBUMIN 4.5   Thyroid Function Tests: Recent Labs    06/07/21 1442 06/07/21 2058  TSH 5.028*  --   FREET4  --  0.91   Anemia Panel: No results for input(s): VITAMINB12, FOLATE, FERRITIN, TIBC, IRON, RETICCTPCT in the last 72 hours.  Urine analysis:    Component Value Date/Time   COLORURINE YELLOW (A) 06/07/2021 1610   APPEARANCEUR CLEAR (A) 06/07/2021 1610   LABSPEC 1.023 06/07/2021 1610   PHURINE 6.0 06/07/2021 1610   GLUCOSEU NEGATIVE 06/07/2021 1610   HGBUR NEGATIVE 06/07/2021 1610   BILIRUBINUR NEGATIVE 06/07/2021 1610   KETONESUR NEGATIVE 06/07/2021 1610   PROTEINUR NEGATIVE 06/07/2021 1610   NITRITE NEGATIVE 06/07/2021 1610   LEUKOCYTESUR NEGATIVE 06/07/2021 1610   Dr. Tobie Poet [facility]  If 7PM-7AM, please contact overnight-coverage provider If 7AM-7PM, please contact day coverage provider [email or web address]  06/07/2021, 10:52 PM

## 2021-06-07 NOTE — Assessment & Plan Note (Signed)
-   Patient takes trazodone 100 mg nightly this has been resumed -Resumed melatonin nightly

## 2021-06-07 NOTE — Assessment & Plan Note (Signed)
-   Initial lactic acid level with 4.7 and improved to 2.1 - Patient did not meet sepsis criteria

## 2021-06-07 NOTE — ED Notes (Signed)
Dr isaacs at bedside. °

## 2021-06-07 NOTE — ED Provider Notes (Signed)
[facility] Provider Note    Event Date/Time   First MD Initiated Contact with Patient 06/07/21 1442     (approximate)   History   Dizziness   HPI  Pam Quinn is a 61 y.o. female here with generalized weakness.  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  She has had dizziness and loss of balance.  She states that every time she stood up and started walking she felt like she needed to hold onto the wall.  She got very lightheaded when standing. The symptoms have seemed to resolve when she rests.  She saw her PCP for this just last week and had also been complaining of tingling in her bilateral upper extremities so was sent to neurology.  Today, she has been progressively weaker and lightheaded again, so she was sent to the ED for evaluation.  She was being seen by neurology today when she got dizzy and her blood pressure was checked, and it was measured at 57/46.  She was sent to the ED.  She was given fluids in route and states she now feels somewhat better.  Denies any ongoing dizziness or lightheadedness.  Denies any focal numbness or weakness.  No chest pain or shortness of breath.  No fevers.  The only significant recent change was a change of her diabetic medication approximately 2 months ago with her PCP.   Physical Exam   Triage Vital Signs: ED Triage Vitals  Enc Vitals Group     BP 06/07/21 1420 (!) 57/46     Pulse Rate 06/07/21 1420 (!) 111     Resp 06/07/21 1420 18     Temp 06/07/21 1420 97.6 F (36.4 C)     Temp src --      SpO2 06/07/21 1420 100 %     Weight --      Height --      Head Circumference --      Peak Flow --      Pain Score 06/07/21 1418 0     Pain Loc --      Pain Edu? --      Excl. in [place]? --     Most  recent vital signs: Vitals:   06/07/21 1525 06/07/21 1555  BP: 111/77 (!) 106/93  Pulse: 89 94  Resp: (!) 25 18  Temp:    SpO2: 98% 100%     General: Awake, no distress.  CV:  Regular rate and rhythm, pulses 1+ but symmetric bilaterally. Resp:  Normal effort.  Clear breath sounds. Abd:  No distention.  No tenderness. Other:  Dry mucous membranes.  Decreased skin turgor.  On neurological exam, cranial nerves II through XII intact.  No numbness.  Strength 5 5 bilateral upper and lower extremities.  Gait deferred.     ED Results / Procedures / Treatments   Labs (all labs ordered are listed, but only abnormal results are displayed) Labs Reviewed  BASIC METABOLIC PANEL - Abnormal; Notable for the following components:      Result Value   CO2 20 (*)    Glucose, Bld 111 (*)    BUN 23 (*)    Creatinine, Ser 1.23 (*)    GFR, Estimated 50 (*)    All other components within normal limits  LACTIC ACID, PLASMA -  Abnormal; Notable for the following components:   Lactic Acid, Venous 4.7 (*)    All other components within normal limits  TSH - Abnormal; Notable for the following components:   TSH 5.028 (*)    All other components within normal limits  CULTURE, BLOOD (ROUTINE X 2)  CULTURE, BLOOD (ROUTINE X 2)  RESP PANEL BY RT-PCR (FLU A&B, COVID) ARPGX2  CBC  HEPATIC FUNCTION PANEL  URINALYSIS, ROUTINE W REFLEX MICROSCOPIC  LACTIC ACID, PLASMA  TROPONIN I (HIGH SENSITIVITY)     EKG  Normal sinus rhythm, ventricular rate 75.  PR 128, QRS 89, QTc 480.  No acute ST elevations or depressions.  EKG evidence of acute ischemia or infarct.   RADIOLOGY Chest x-ray: On my review, no acute abnormality.  Pending radiology read.    PROCEDURES:  Critical Care performed: No  .1-3 Lead EKG Interpretation Performed by: Duffy Bruce, MD Authorized by: Duffy Bruce, MD     Interpretation: normal     ECG rate:  70-80   ECG rate assessment: normal     Rhythm: sinus rhythm      Ectopy: none     Conduction: normal   Comments:     Indication: Weakness    MEDICATIONS ORDERED IN ED: Medications  ceFEPIme (MAXIPIME) 2 g in sodium chloride 0.9 % 100 mL IVPB (2 g Intravenous New Bag/Given 06/07/21 1550)  metroNIDAZOLE (FLAGYL) IVPB 500 mg (500 mg Intravenous New Bag/Given 06/07/21 1555)  vancomycin (VANCOCIN) IVPB 1000 mg/200 mL premix (has no administration in time range)  ondansetron (ZOFRAN) injection 4 mg (4 mg Intravenous Given 06/07/21 1442)  sodium chloride 0.9 % bolus 1,000 mL (0 mLs Intravenous Stopped 06/07/21 1552)  sodium chloride 0.9 % bolus 1,000 mL (0 mLs Intravenous Stopped 06/07/21 1557)  iohexol (OMNIPAQUE) 300 MG/ML solution 100 mL (100 mLs Intravenous Contrast Given 06/07/21 1504)  lactated ringers bolus 500 mL (500 mLs Intravenous New Bag/Given 06/07/21 1558)     IMPRESSION / MDM / ASSESSMENT AND PLAN / ED COURSE  I reviewed the triage vital signs and the nursing notes.                               The patient is on the cardiac monitor to evaluate for evidence of arrhythmia and/or significant heart rate changes.  61 year old female with history of diabetes, CKD, reported diabetic neuropathy, here with generalized weakness.  Patient profoundly hypotensive on arrival with lightheadedness and symptoms of hypoperfusion.  She was immediately started on IV fluids with significant improvement.  Differential includes profound hypovolemia from poor p.o. intake and possible diabetic diuresis, sepsis due to occult infection such as UTI.  Unlikely medication related.  She does have reported diabetic neuropathy autonomic neuropathy though has never had something similar to this.  Will plan to send broad screening lab work.  Lab work pending.  Lactic acid did return at 4.7.  CBC shows no leukocytosis.  At this point, given her hypotension and lactic acidosis with unexplained etiology, will cover empirically with broad-spectrum antibiotics in the event of occult  sepsis.  Plan to follow-up CT scan, remainder of lab work, and disposition accordingly.  Signed out to Dr. Starleen Blue.  MEDICATIONS GIVEN IN ED: Medications  ceFEPIme (MAXIPIME) 2 g in sodium chloride 0.9 % 100 mL IVPB (2 g Intravenous New Bag/Given 06/07/21 1550)  metroNIDAZOLE (FLAGYL) IVPB 500 mg (500 mg Intravenous New Bag/Given 06/07/21 1555)  vancomycin (VANCOCIN) IVPB 1000 mg/200  mL premix (has no administration in time range)  ondansetron (ZOFRAN) injection 4 mg (4 mg Intravenous Given 06/07/21 1442)  sodium chloride 0.9 % bolus 1,000 mL (0 mLs Intravenous Stopped 06/07/21 1552)  sodium chloride 0.9 % bolus 1,000 mL (0 mLs Intravenous Stopped 06/07/21 1557)  iohexol (OMNIPAQUE) 300 MG/ML solution 100 mL (100 mLs Intravenous Contrast Given 06/07/21 1504)  lactated ringers bolus 500 mL (500 mLs Intravenous New Bag/Given 06/07/21 1558)     EMR reviewed PCP visits including visit with PCP 1/10 for hand numbness, weakness, and visit notes from Dr. Lannie Fields office today.     FINAL CLINICAL IMPRESSION(S) / ED DIAGNOSES   Final diagnoses:  Hypotension, unspecified hypotension type  Lactic acidosis     Rx / DC Orders   ED Discharge Orders     None        Note:  This document was prepared using Dragon voice recognition software and may include unintentional dictation errors.   Duffy Bruce, MD 06/07/21 1606

## 2021-06-07 NOTE — Assessment & Plan Note (Signed)
-   CKD 3 AA is at baseline - Serum creatinine on presentation is 1.23/GFR 50 - Baseline serum creatinine is 0.9/GFR 64-1.0/GFR 51 - BMP in the a.m.

## 2021-06-07 NOTE — Hospital Course (Signed)
Ms. Burma Purtee is a 61 year old female with medical history of hypertension, non-insulin-dependent diabetes mellitus type 2, neuropathy, insomnia, depression/anxiety, who presents emergency department for chief concerns of weakness and dizziness for several weeks.  In the emergency department patient had 1 emesis, nonbloody, episode.  Initial vitals in the emergency department showed temperature of 97.6, respiration rate of 14, heart rate of 75, initial blood pressure 95/68 and improved to 162/89, respiration SPO2 was 99% on room air.  Labs in the emergency department revealed serum sodium 138, potassium 4.1, chloride 104, bicarb of 20, BUN of 25, serum creatinine of 1.23, nonfasting glucose 111, WBC 8.1, hemoglobin 13.3, platelets of 318, GFR of 50, TSH 5.028, lactic acid was initially elevated at 4.7 and improved to 2.1.  COVID/influenza A/influenza B PCR were negative.  High sensitive troponin was negative x2.  Blood cultures x2 were collected and are in process.  Patient was given LR 500 mL bolus, sodium chloride 1 L bolus, broad-spectrum antibiotic with cefepime 2 g IV once, Flagyl 500 mg IV once, and vancomycin 1 g IV once per pharmacy.

## 2021-06-07 NOTE — Assessment & Plan Note (Signed)
-   Given the patient had initial lactic acid elevation and patient was treated for sepsis - Lisinopril 5 mg daily tablet has been resumed for 06/08/2020 - Hydralazine injection 5 mg IV every 6 hours as needed for SBP greater than 180, 3 doses ordered - Heart healthy/carb modified diet

## 2021-06-07 NOTE — Assessment & Plan Note (Signed)
-   Query gastroenteritis - As needed ondansetron p.o. and IV for nausea and vomiting ordered

## 2021-06-07 NOTE — ED Notes (Signed)
CRITICAL VALUE STICKER  CRITICAL VALUE:lactic acid 4.7  RECEIVER (on-site recipient of call):Pam Quinn  DATE & TIME NOTIFIED: 06/07/21  MESSENGER (representative from lab):lab  MD NOTIFIED: [place]  TIME OF NOTIFICATION:1322  RESPONSE:

## 2021-06-07 NOTE — Assessment & Plan Note (Addendum)
-   Etiology work-up in progress - Patient had increased lactic acid, no elevated WBC and UA was negative for leukocytes and nitrates - Patient was treated for sepsis with bolus fluid and broad-spectrum antibiotic: Sodium chloride 1 L bolus, lactated ringer 500 mL bolus, Flagyl 500 mg IV once, vancomycin 1 g IV once, cefepime 2 g IV once per EDP. - Serum high-sensitivity troponin was negative x2, COVID/influenza A/influenza B PCR were negative - TSH was elevated at 5.028, check free T4 for hypo versus subclinical acquired hypothyroidism - Given that patient is on metformin, B12 deficiency cannot be excluded at this time, check B12 - Check magnesium, procalcitonin, BNP - Blood cultures x2 have been collected and are in process - Admit to telemetry medical, observation

## 2021-06-07 NOTE — Assessment & Plan Note (Signed)
-   Patient takes escitalopram 10 mg daily, resumed - Trazodone 100 mg p.o. nightly resumed

## 2021-06-07 NOTE — Assessment & Plan Note (Addendum)
-   Metformin 1000 mg p.o. twice daily has been held at this time due to increased lactic acid on ED presentation - Insulin SSI with at bedtime coverage ordered - Goal inpatient blood glucose level is 140-180 - Heart healthy/carb modified diet ordered

## 2021-06-07 NOTE — Assessment & Plan Note (Signed)
-   Patient takes regimen of laxative in order to have a bowel movement and she is due for her medication on 06/08/2021 - I have offered to resume the Celexa this however she declined stating that she would rather have her weekly bowel movements at home if she gets discharged tomorrow and/or the following day

## 2021-06-07 NOTE — ED Triage Notes (Addendum)
Pt comes with c/o dizziness for two weeks. Pt states some SOB that has been going on as well. Pt denies any CP. Pt states he got dizzy earlier at MD appt and almost passed out.  Pt appears very pale at this time. Current BP checked twice is at 57/46. Pt is clammy and diaphoretic.

## 2021-06-07 NOTE — Consult Note (Signed)
CODE SEPSIS - PHARMACY COMMUNICATION  **Broad Spectrum Antibiotics should be administered within 1 hour of Sepsis diagnosis**  Time Code Sepsis Called/Page Received: 1532  Antibiotics Ordered: Cefepime 2gm IV,Vancomycin 1gm IV, Metronidazole 500gm IV  Time of 1st antibiotic administration: cefepime 2gm IV given 1/16 at 1550  Additional action taken by pharmacy: N/A  If necessary, Name of Provider/Nurse Contacted: N/A     Darrick Penna ,PharmD Clinical Pharmacist  06/07/2021  3:36 PM

## 2021-06-07 NOTE — Sepsis Progress Note (Signed)
Sepsis protocol monitored by eLink 

## 2021-06-07 NOTE — Assessment & Plan Note (Signed)
-   Magnesium 2 g IV infusion ordered over 120 minutes - Magnesium level rechecked in the a.m. ordered

## 2021-06-08 DIAGNOSIS — R531 Weakness: Secondary | ICD-10-CM | POA: Diagnosis not present

## 2021-06-08 LAB — CBC
HCT: 36.8 % (ref 36.0–46.0)
Hemoglobin: 11.8 g/dL — ABNORMAL LOW (ref 12.0–15.0)
MCH: 27.9 pg (ref 26.0–34.0)
MCHC: 32.1 g/dL (ref 30.0–36.0)
MCV: 87 fL (ref 80.0–100.0)
Platelets: 226 10*3/uL (ref 150–400)
RBC: 4.23 MIL/uL (ref 3.87–5.11)
RDW: 13.2 % (ref 11.5–15.5)
WBC: 6.9 10*3/uL (ref 4.0–10.5)
nRBC: 0 % (ref 0.0–0.2)

## 2021-06-08 LAB — BASIC METABOLIC PANEL
Anion gap: 8 (ref 5–15)
BUN: 19 mg/dL (ref 6–20)
CO2: 29 mmol/L (ref 22–32)
Calcium: 9.4 mg/dL (ref 8.9–10.3)
Chloride: 103 mmol/L (ref 98–111)
Creatinine, Ser: 1.02 mg/dL — ABNORMAL HIGH (ref 0.44–1.00)
GFR, Estimated: >60 mL/min (ref >60)
Glucose, Bld: 120 mg/dL — ABNORMAL HIGH (ref 70–99)
Potassium: 4.2 mmol/L (ref 3.5–5.1)
Sodium: 140 mmol/L (ref 135–145)

## 2021-06-08 LAB — VITAMIN D 25 HYDROXY (VIT D DEFICIENCY, FRACTURES): Vit D, 25-Hydroxy: 26.09 ng/mL — ABNORMAL LOW (ref 30–100)

## 2021-06-08 LAB — HIV ANTIBODY (ROUTINE TESTING W REFLEX): HIV Screen 4th Generation wRfx: NONREACTIVE

## 2021-06-08 LAB — HEMOGLOBIN A1C
Hgb A1c MFr Bld: 6.1 % — ABNORMAL HIGH (ref 4.8–5.6)
Mean Plasma Glucose: 128.37 mg/dL

## 2021-06-08 LAB — MAGNESIUM: Magnesium: 2.1 mg/dL (ref 1.7–2.4)

## 2021-06-08 LAB — GLUCOSE, CAPILLARY
Glucose-Capillary: 129 mg/dL — ABNORMAL HIGH (ref 70–99)
Glucose-Capillary: 192 mg/dL — ABNORMAL HIGH (ref 70–99)

## 2021-06-08 LAB — VITAMIN B12: Vitamin B-12: 2009 pg/mL — ABNORMAL HIGH (ref 180–914)

## 2021-06-08 MED ORDER — VITAMIN D (ERGOCALCIFEROL) 1.25 MG (50000 UNIT) PO CAPS
50000.0000 [IU] | ORAL_CAPSULE | ORAL | Status: DC
  Filled 2021-06-08: qty 1

## 2021-06-08 MED ORDER — POLYETHYLENE GLYCOL 3350 17 G PO PACK: 17.0000 g | PACK | Freq: Two times a day (BID) | ORAL | 0 refills | Status: AC

## 2021-06-08 MED ORDER — POLYETHYLENE GLYCOL 3350 17 G PO PACK
17.0000 g | PACK | Freq: Two times a day (BID) | ORAL | Status: DC
  Administered 2021-06-08: 17 g via ORAL
  Filled 2021-06-08: qty 1

## 2021-06-08 MED ORDER — SODIUM CHLORIDE 0.9 % IV SOLN: INTRAVENOUS | Status: DC

## 2021-06-08 MED ORDER — SENNA 8.6 MG PO TABS: 1.0000 | ORAL_TABLET | Freq: Every day | ORAL | 0 refills | Status: AC

## 2021-06-08 MED ORDER — VITAMIN D (ERGOCALCIFEROL) 1.25 MG (50000 UNIT) PO CAPS: 50000.0000 [IU] | ORAL_CAPSULE | ORAL | 0 refills | Status: AC

## 2021-06-08 NOTE — Progress Notes (Signed)
PT Screen Note  Patient Details Name: Pam Quinn MRN: [id number] DOB: 12-26-1960   Cancelled Treatment:    Reason Eval/Treat Not Completed: PT screened, no needs identified, will sign off Pt laying in bed in good spirits and daughter present.  She was able to easily and confidently circumambulate the nurses' station and negotiate up/down flight of steps all safely and with good confidence.  Pt feeling back to her baseline, slight drop in O2 (high to mid 90s) and increase in HR (70s to 100s) with the effort.  No PT needs.   Kreg Shropshire, DPT 06/08/2021, 2:05 PM

## 2021-06-08 NOTE — TOC Initial Note (Signed)
Transition of Care [facility]) - Initial/Assessment Note    Patient Details  Name: Pam Quinn MRN: [id number] Date of Birth: September 06, 1960  Transition of Care [facility]) CM/SW Contact:    Beverly Sessions, RN Phone Number: 06/08/2021, 10:02 AM  Clinical Narrative:                  Transition of Care [facility]) Screening Note   Patient Details  Name: Pam Quinn Date of Birth: 08/31/1960   Transition of Care [facility]) CM/SW Contact:    Beverly Sessions, RN Phone Number: 06/08/2021, 10:02 AM    Transition of Care Department (TOC) has reviewed patient and no TOC needs have been identified at this time. We will continue to monitor patient advancement through interdisciplinary progression rounds. If new patient transition needs arise, please place a TOC consult.          Patient Goals and CMS Choice        Expected Discharge Plan and Services                                                Prior Living Arrangements/Services                       Activities of Daily Living Home Assistive Devices/Equipment: Dentures (specify type), Eyeglasses ADL Screening (condition at time of admission) Patient's cognitive ability adequate to safely complete daily activities?: Yes Is the patient deaf or have difficulty hearing?: No Does the patient have difficulty seeing, even when wearing glasses/contacts?: No Does the patient have difficulty concentrating, remembering, or making decisions?: No Patient able to express need for assistance with ADLs?: Yes Does the patient have difficulty dressing or bathing?: No Independently performs ADLs?: Yes (appropriate for developmental age) Does the patient have difficulty walking or climbing stairs?: No Weakness of Legs: None Weakness of Arms/Hands: None  Permission Sought/Granted                  Emotional Assessment              Admission diagnosis:  Lactic acidosis [E87.20] Weakness [R53.1] Hypotension,  unspecified hypotension type [I95.9] Patient Active Problem List   Diagnosis Date Noted   Weakness 06/07/2021   Essential hypertension 06/07/2021   Depression 06/07/2021   Insomnia 06/07/2021   Neuropathy 06/07/2021   Emesis 06/07/2021   Stage 3a chronic kidney disease (CKD) ([place]) 06/07/2021   Diabetes mellitus type 2, noninsulin dependent ([place]) 06/07/2021   Lactic acid increased 06/07/2021   Constipation 06/07/2021   Hypomagnesemia 06/07/2021   Unintentional weight loss 06/07/2021   PCP:  Dion Body, MD Pharmacy:   [facility] [address], [place] - [address] [place] [address] Phone: [phone] Fax: [phone]     Social Determinants of Health (SDOH) Interventions    Readmission Risk Interventions No flowsheet data found.

## 2021-06-08 NOTE — Discharge Summary (Signed)
Physician Discharge Summary  Pam Quinn [id number] DOB: 31-Mar-1961 DOA: 06/07/2021  PCP: Dion Body, MD  Admit date: 06/07/2021 Discharge date: 06/08/2021  Admitted From: Home  Disposition:  Home   Recommendations for Outpatient Follow-up:  Follow up with PCP in 1-2 weeks Please obtain BMP/CBC in one week Needs Repeat TSH in 4 weeks.  Needs follow up with GI for IBS, constipation predominant.    Home Health: Discharge Condition: Stable.  CODE STATUS: Full code Diet recommendation: Heart Healthy   Brief/Interim Summary: 61 year old with past medical history significant for hypertension, non-insulin-dependent diabetes type 2, neuropathy, depression anxiety who presents to the ED complaining of weakness and dizziness for several weeks.  In the ED patient had 1 episode of nonbloody emesis.   Evaluation in the ED: Labs BUN 25, creatinine 1.2, sodium 138, glucose 111, white blood cell 8.1 hemoglobin 13 TSH 5.028, lactic acid was initially elevated 4.7 and subsequently decreased to 2.1. Influenza PCR negative, high sensitive troponin was negative x2.   She was waiting to be evaluated in an outpatient neurologist office, Dr. Starleen Blue when she developed clumsiness, dizziness and nausea.  She was transported subsequently to the ED. She reports 18 pound weight loss over the last month.       1-Weakness, Dizziness: Suspect related to hypovolemia, dehydration. BP on admission in the 90.  After IV fluids, her symptoms has resolved.  Will defer at this time MRI brain.  Encourage oral intake.  Plan to have PT evaluation prior to discharge.   Essential hypertension BP increased today.. resume lisinopril.   Weight loss: She report that since she has been taking Mounjaro, she has had decrease appetite.   Follow out patient with PPC>    Nausea vomiting:  Resolved.   Diabetes type 2; resume home regimen at discharge.   Neuropathy: Resume gabapentin.   Stage IIIa CKD:   Stable.   Hypomagnesemia: Replaced.   Constipation; IBS She would like to take her laxative when she get home.  She will follow up with her GI for further medications.    Lactic  acidosis: Resolved with IV fluids.  Discontinue metformin at discharge.  No evidence of infection: UA negative, Chest  xray: negative. CT scan ; constipation.  Suspect related to hypoperfusion.    Depression: resume meds.      Discharge Diagnoses:  Principal Problem:   Weakness Active Problems:   Essential hypertension   Depression   Insomnia   Neuropathy   Emesis   Stage 3a chronic kidney disease (CKD) (HCC)   Diabetes mellitus type 2, noninsulin dependent (HCC)   Lactic acid increased   Constipation   Hypomagnesemia   Unintentional weight loss    Discharge Instructions  Discharge Instructions     Diet - low sodium heart healthy   Complete by: As directed    Increase activity slowly   Complete by: As directed       Allergies as of 06/08/2021       Reactions   Statins Other (See Comments)   Joint pain        Medication List     STOP taking these medications    metFORMIN 500 MG tablet Commonly known as: GLUCOPHAGE       TAKE these medications    albuterol 108 (90 Base) MCG/ACT inhaler Commonly known as: VENTOLIN HFA Inhale 2 puffs into the lungs every 4 (four) hours as needed for wheezing or shortness of breath.   aspirin 81 MG EC tablet Take 81  mg by mouth 4 (four) times a week.   escitalopram 10 MG tablet Commonly known as: LEXAPRO Take 10 mg by mouth daily.   gabapentin 300 MG capsule Commonly known as: NEURONTIN Take 300-600 mg by mouth 2 (two) times daily. Take 300 mg every morning and 600 mg at bedtime   lisinopril 5 MG tablet Commonly known as: ZESTRIL Take 5 mg by mouth daily.   melatonin 3 MG Tabs tablet Take 15 mg by mouth at bedtime.   Mounjaro 5 MG/0.5ML Pen Generic drug: tirzepatide Inject 5 mg into the skin once a week. Wednesday    multivitamin with minerals tablet Take 1 tablet by mouth daily.   NovoLOG Mix 70/30 FlexPen (70-30) 100 UNIT/ML FlexPen Generic drug: insulin aspart protamine - aspart 10-20 Units 2 (two) times daily with a meal.   ONE TOUCH ULTRA TEST test strip Generic drug: glucose blood   polyethylene glycol 17 g packet Commonly known as: MIRALAX / GLYCOLAX Take 17 g by mouth 2 (two) times daily.   senna 8.6 MG Tabs tablet Commonly known as: SENOKOT Take 1 tablet (8.6 mg total) by mouth daily.   traZODone 100 MG tablet Commonly known as: DESYREL Take 100 mg by mouth at bedtime.   Vitamin D (Ergocalciferol) 1.25 MG (50000 UNIT) Caps capsule Commonly known as: DRISDOL Take 1 capsule (50,000 Units total) by mouth every 7 (seven) days.        Allergies  Allergen Reactions   Statins Other (See Comments)    Joint pain    Consultations: None  Procedures/Studies: DG Chest 1 View  Result Date: 06/07/2021 CLINICAL DATA:  Shortness of breath, dizziness for 2 weeks EXAM: CHEST  1 VIEW COMPARISON:  None. FINDINGS: The cardiomediastinal silhouette is normal. There is no focal consolidation or pulmonary edema. There is no pleural effusion or pneumothorax. There is no acute osseous abnormality. IMPRESSION: No radiographic evidence of acute cardiopulmonary process. Electronically Signed   By: Valetta Mole M.D.   On: 06/07/2021 15:09   CT HEAD WO CONTRAST (5MM)  Result Date: 06/07/2021 CLINICAL DATA:  Altered mental status. EXAM: CT HEAD WITHOUT CONTRAST TECHNIQUE: Contiguous axial images were obtained from the base of the skull through the vertex without intravenous contrast. RADIATION DOSE REDUCTION: This exam was performed according to the departmental dose-optimization program which includes automated exposure control, adjustment of the mA and/or kV according to patient size and/or use of iterative reconstruction technique. COMPARISON:  None. FINDINGS: Brain: No evidence of acute infarction,  hemorrhage, hydrocephalus, extra-axial collection or mass lesion/mass effect. Vascular: Atherosclerotic vascular calcification of the carotid siphons. No hyperdense vessel. Skull: Normal. Negative for fracture or focal lesion. Sinuses/Orbits: No acute finding. Other: None. IMPRESSION: 1. No acute intracranial abnormality. Electronically Signed   By: Titus Dubin M.D.   On: 06/07/2021 15:20   CT ABDOMEN PELVIS W CONTRAST  Result Date: 06/07/2021 CLINICAL DATA:  Abdominal pain EXAM: CT ABDOMEN AND PELVIS WITH CONTRAST TECHNIQUE: Multidetector CT imaging of the abdomen and pelvis was performed using the standard protocol following bolus administration of intravenous contrast. RADIATION DOSE REDUCTION: This exam was performed according to the departmental dose-optimization program which includes automated exposure control, adjustment of the mA and/or kV according to patient size and/or use of iterative reconstruction technique. CONTRAST:  119mL OMNIPAQUE IOHEXOL 300 MG/ML  SOLN COMPARISON:  None. FINDINGS: Lower chest: No acute abnormality. Hepatobiliary: Liver is normal in size and contour. 2.4 cm hypodensity adjacent to the gallbladder fossa which likely represents focal fatty infiltration. Gallbladder  is surgically absent. No biliary ductal dilatation identified. Pancreas: Mildly atrophic with no suspicious mass, inflammatory changes or ductal dilatation identified. Spleen: Normal in size without focal abnormality. Adrenals/Urinary Tract: Adrenal glands are within normal limits. Kidneys appear normal with no hydronephrosis. Mild circumferential urinary bladder wall thickening. Stomach/Bowel: No bowel obstruction, free air or pneumatosis. No bowel wall edema identified. Large amount of retained fecal material throughout the colon. No evidence of acute appendicitis. Vascular/Lymphatic: Aortic atherosclerosis. No enlarged abdominal or pelvic lymph nodes. Reproductive: Uterus and bilateral adnexa are unremarkable.  Other: No abdominal wall hernia or abnormality. No abdominopelvic ascites. Musculoskeletal: No acute or significant osseous findings. IMPRESSION: 1. Mild urinary bladder wall thickening, correlate with urinalysis. 2. Large amount of retained fecal material throughout the colon. 3. 2.4 cm hepatic hypodensity adjacent to the gallbladder fossa, likely represents focal fatty infiltration. Consider nonemergent follow-up with ultrasound or MRI. Electronically Signed   By: Ofilia Neas M.D.   On: 06/07/2021 15:25     Subjective: She is feeling better. Dizziness has resolved. Dizziness was specially when she stand up and on ambulation.    Discharge Exam: Vitals:   06/08/21 0404 06/08/21 0743  BP: (!) 107/52 140/75  Pulse: 71 71  Resp: 16   Temp: 98.3 F (36.8 C) 98.8 F (37.1 C)  SpO2: 96% 99%     General: Pt is alert, awake, not in acute distress Cardiovascular: RRR, S1/S2 +, no rubs, no gallops Respiratory: CTA bilaterally, no wheezing, no rhonchi Abdominal: Soft, NT, ND, bowel sounds + Extremities: no edema, no cyanosis    The results of significant diagnostics from this hospitalization (including imaging, microbiology, ancillary and laboratory) are listed below for reference.     Microbiology: Recent Results (from the past 240 hour(s))  Blood culture (routine x 2)     Status: None (Preliminary result)   Collection Time: 06/07/21  2:42 PM   Specimen: BLOOD  Result Value Ref Range Status   Specimen Description BLOOD RIGHT ANTECUBITAL  Final   Special Requests   Final    BOTTLES DRAWN AEROBIC AND ANAEROBIC Blood Culture adequate volume   Culture   Final    NO GROWTH < 24 HOURS Performed at [facility], [address], [place], [place] [address]    Report Status PENDING  Incomplete  Resp Panel by RT-PCR (Flu A&B, Covid) Nasopharyngeal Swab     Status: None   Collection Time: 06/07/21  2:42 PM   Specimen: Nasopharyngeal Swab; Nasopharyngeal(NP) swabs in vial  transport medium  Result Value Ref Range Status   SARS Coronavirus 2 by RT PCR NEGATIVE NEGATIVE Final    Comment: (NOTE) SARS-CoV-2 target nucleic acids are NOT DETECTED.  The SARS-CoV-2 RNA is generally detectable in upper respiratory specimens during the acute phase of infection. The lowest concentration of SARS-CoV-2 viral copies this assay can detect is 138 copies/mL. A negative result does not preclude SARS-Cov-2 infection and should not be used as the sole basis for treatment or other patient management decisions. A negative result may occur with  improper specimen collection/handling, submission of specimen other than nasopharyngeal swab, presence of viral mutation(s) within the areas targeted by this assay, and inadequate number of viral copies(<138 copies/mL). A negative result must be combined with clinical observations, patient history, and epidemiological information. The expected result is Negative.  Fact Sheet for Patients:  EntrepreneurPulse.com.au  Fact Sheet for Healthcare Providers:  IncredibleEmployment.be  This test is no t yet approved or cleared by the [place] and  has been authorized for detection and/or diagnosis of SARS-CoV-2 by FDA under an Emergency Use Authorization (EUA). This EUA will remain  in effect (meaning this test can be used) for the duration of the COVID-19 declaration under Section 564(b)(1) of the Act, 21 U.S.C.section 360bbb-3(b)(1), unless the authorization is terminated  or revoked sooner.       Influenza A by PCR NEGATIVE NEGATIVE Final   Influenza B by PCR NEGATIVE NEGATIVE Final    Comment: (NOTE) The Xpert Xpress SARS-CoV-2/FLU/RSV plus assay is intended as an aid in the diagnosis of influenza from Nasopharyngeal swab specimens and should not be used as a sole basis for treatment. Nasal washings and aspirates are unacceptable for Xpert Xpress SARS-CoV-2/FLU/RSV testing.  Fact  Sheet for Patients: EntrepreneurPulse.com.au  Fact Sheet for Healthcare Providers: IncredibleEmployment.be  This test is not yet approved or cleared by the [place] FDA and has been authorized for detection and/or diagnosis of SARS-CoV-2 by FDA under an Emergency Use Authorization (EUA). This EUA will remain in effect (meaning this test can be used) for the duration of the COVID-19 declaration under Section 564(b)(1) of the Act, 21 U.S.C. section 360bbb-3(b)(1), unless the authorization is terminated or revoked.  Performed at [facility], [address] [place], [place] [address]   Blood culture (routine x 2)     Status: None (Preliminary result)   Collection Time: 06/07/21  3:40 PM   Specimen: BLOOD  Result Value Ref Range Status   Specimen Description BLOOD BLOOD LEFT FOREARM  Final   Special Requests   Final    BOTTLES DRAWN AEROBIC AND ANAEROBIC Blood Culture results may not be optimal due to an inadequate volume of blood received in culture bottles   Culture   Final    NO GROWTH < 24 HOURS Performed at [facility], [address] [place], [place] [address]    Report Status PENDING  Incomplete     Labs: BNP (last 3 results) Recent Labs    06/07/21 2058  BNP 0000000   Basic Metabolic Panel: Recent Labs  Lab 06/07/21 1420 06/07/21 2058 06/08/21 0427  NA 138  --  140  K 4.1  --  4.2  CL 104  --  103  CO2 20*  --  29  GLUCOSE 111*  --  120*  BUN 23*  --  19  CREATININE 1.23*  --  1.02*  CALCIUM 9.5  --  9.4  MG  --  1.3* 2.1   Liver Function Tests: Recent Labs  Lab 06/07/21 1442  AST 27  ALT 22  ALKPHOS 54  BILITOT 0.6  PROT 7.5  ALBUMIN 4.5   No results for input(s): LIPASE, AMYLASE in the last 168 hours. No results for input(s): AMMONIA in the last 168 hours. CBC: Recent Labs  Lab 06/07/21 1420 06/08/21 0427  WBC 8.1 6.9  HGB 13.3 11.8*  HCT 40.8 36.8  MCV 87.0 87.0  PLT 318 226    Cardiac Enzymes: No results for input(s): CKTOTAL, CKMB, CKMBINDEX, TROPONINI in the last 168 hours. BNP: Invalid input(s): POCBNP CBG: Recent Labs  Lab 06/07/21 2039 06/08/21 0743 06/08/21 1120  GLUCAP 146* 129* 192*   D-Dimer No results for input(s): DDIMER in the last 72 hours. Hgb A1c Recent Labs    06/07/21 2058  HGBA1C 6.1*   Lipid Profile No results for input(s): CHOL, HDL, LDLCALC, TRIG, CHOLHDL, LDLDIRECT in the last 72 hours. Thyroid function studies Recent Labs    06/07/21 1442  TSH 5.028*   Anemia work up Recent Labs    06/07/21 2058  VITAMINB12 2,009*   Urinalysis    Component Value Date/Time   COLORURINE YELLOW (A) 06/07/2021 1610   APPEARANCEUR CLEAR (A) 06/07/2021 1610   LABSPEC 1.023 06/07/2021 1610   PHURINE 6.0 06/07/2021 1610   GLUCOSEU NEGATIVE 06/07/2021 1610   HGBUR NEGATIVE 06/07/2021 1610   BILIRUBINUR NEGATIVE 06/07/2021 1610   KETONESUR NEGATIVE 06/07/2021 1610   PROTEINUR NEGATIVE 06/07/2021 1610   NITRITE NEGATIVE 06/07/2021 1610   LEUKOCYTESUR NEGATIVE 06/07/2021 1610   Sepsis Labs Invalid input(s): PROCALCITONIN,  WBC,  LACTICIDVEN Microbiology Recent Results (from the past 240 hour(s))  Blood culture (routine x 2)     Status: None (Preliminary result)   Collection Time: 06/07/21  2:42 PM   Specimen: BLOOD  Result Value Ref Range Status   Specimen Description BLOOD RIGHT ANTECUBITAL  Final   Special Requests   Final    BOTTLES DRAWN AEROBIC AND ANAEROBIC Blood Culture adequate volume   Culture   Final    NO GROWTH < 24 HOURS Performed at [facility], [address] [place], [place] [address]    Report Status PENDING  Incomplete  Resp Panel by RT-PCR (Flu A&B, Covid) Nasopharyngeal Swab     Status: None   Collection Time: 06/07/21  2:42 PM   Specimen: Nasopharyngeal Swab; Nasopharyngeal(NP) swabs in vial transport medium  Result Value Ref Range Status   SARS Coronavirus 2 by RT PCR NEGATIVE NEGATIVE  Final    Comment: (NOTE) SARS-CoV-2 target nucleic acids are NOT DETECTED.  The SARS-CoV-2 RNA is generally detectable in upper respiratory specimens during the acute phase of infection. The lowest concentration of SARS-CoV-2 viral copies this assay can detect is 138 copies/mL. A negative result does not preclude SARS-Cov-2 infection and should not be used as the sole basis for treatment or other patient management decisions. A negative result may occur with  improper specimen collection/handling, submission of specimen other than nasopharyngeal swab, presence of viral mutation(s) within the areas targeted by this assay, and inadequate number of viral copies(<138 copies/mL). A negative result must be combined with clinical observations, patient history, and epidemiological information. The expected result is Negative.  Fact Sheet for Patients:  EntrepreneurPulse.com.au  Fact Sheet for Healthcare Providers:  IncredibleEmployment.be  This test is no t yet approved or cleared by the [place] FDA and  has been authorized for detection and/or diagnosis of SARS-CoV-2 by FDA under an Emergency Use Authorization (EUA). This EUA will remain  in effect (meaning this test can be used) for the duration of the COVID-19 declaration under Section 564(b)(1) of the Act, 21 U.S.C.section 360bbb-3(b)(1), unless the authorization is terminated  or revoked sooner.       Influenza A by PCR NEGATIVE NEGATIVE Final   Influenza B by PCR NEGATIVE NEGATIVE Final    Comment: (NOTE) The Xpert Xpress SARS-CoV-2/FLU/RSV plus assay is intended as an aid in the diagnosis of influenza from Nasopharyngeal swab specimens and should not be used as a sole basis for treatment. Nasal washings and aspirates are unacceptable for Xpert Xpress SARS-CoV-2/FLU/RSV testing.  Fact Sheet for Patients: EntrepreneurPulse.com.au  Fact Sheet for Healthcare  Providers: IncredibleEmployment.be  This test is not yet approved or cleared by the [place] FDA and has been authorized for detection and/or diagnosis of SARS-CoV-2 by FDA under an Emergency Use Authorization (EUA). This EUA will remain in effect (meaning this test can be used) for the duration of the  COVID-19 declaration under Section 564(b)(1) of the Act, 21 U.S.C. section 360bbb-3(b)(1), unless the authorization is terminated or revoked.  Performed at [facility], [address] [place], [place] [address]   Blood culture (routine x 2)     Status: None (Preliminary result)   Collection Time: 06/07/21  3:40 PM   Specimen: BLOOD  Result Value Ref Range Status   Specimen Description BLOOD BLOOD LEFT FOREARM  Final   Special Requests   Final    BOTTLES DRAWN AEROBIC AND ANAEROBIC Blood Culture results may not be optimal due to an inadequate volume of blood received in culture bottles   Culture   Final    NO GROWTH < 24 HOURS Performed at [facility], [address], [place], [place] [address]    Report Status PENDING  Incomplete     Time coordinating discharge: 40 minutes  SIGNED:   Elmarie Shiley, MD  [facility]

## 2021-06-12 LAB — CULTURE, BLOOD (ROUTINE X 2)
Culture: NO GROWTH
Culture: NO GROWTH
Special Requests: ADEQUATE

## 2021-09-07 ENCOUNTER — Other Ambulatory Visit: Payer: Self-pay | Admitting: Family Medicine

## 2021-09-07 DIAGNOSIS — Z1231 Encounter for screening mammogram for malignant neoplasm of breast: Secondary | ICD-10-CM

## 2021-11-10 ENCOUNTER — Ambulatory Visit
Admission: RE | Admit: 2021-11-10 | Discharge: 2021-11-10 | Disposition: A | Discharge status: Home / Self Care | Source: Ambulatory Visit | Attending: Family Medicine | Admitting: Family Medicine

## 2021-11-10 DIAGNOSIS — Z1231 Encounter for screening mammogram for malignant neoplasm of breast: Secondary | ICD-10-CM | POA: Diagnosis not present

## 2022-04-05 ENCOUNTER — Ambulatory Visit (INDEPENDENT_AMBULATORY_CARE_PROVIDER_SITE_OTHER): Admitting: Podiatry

## 2022-04-05 DIAGNOSIS — E119 Type 2 diabetes mellitus without complications: Secondary | ICD-10-CM

## 2022-04-05 NOTE — Progress Notes (Signed)
   Chief Complaint  Patient presents with   Callouses    Patient is here for callous on right bottom foot, patient is diabetic.    HPI: 61 y.o. female PMHx diabetes mellitus presenting today for evaluation of numbness with a callus to the plantar aspect of the right foot specifically.  Patient states that she does go barefoot throughout the house.  She is experiencing thick callus skin to the plantar aspect of the right foot.  She says it can be tender intermittently.  Denies a history of injury.  Has not done anything for treatment  Past Medical History:  Diagnosis Date   Allergy    Arthritis    Diabetes mellitus without complication (HCC)    Neuropathy     Past Surgical History:  Procedure Laterality Date   BREAST SURGERY     CESAREAN SECTION     GALLBLADDER SURGERY     REDUCTION MAMMAPLASTY Bilateral 1988   approximate date    Allergies  Allergen Reactions   Statins Other (See Comments)    Joint pain     Physical Exam: General: The patient is alert and oriented x3 in no acute distress.  Dermatology: Skin is warm, dry and supple bilateral lower extremities. Negative for open lesions or macerations.  There is some diffuse normal callus tissue noted to the pressure points of the bilateral feet  Vascular: Palpable pedal pulses bilaterally. Capillary refill within normal limits.  Negative for any significant edema or erythema  Neurological: Light touch and protective threshold grossly intact  Musculoskeletal Exam: High arches noted bilateral.  No pain on palpation throughout the foot.  Range of motion WNL  Assessment: 1.  Intermittently symptomatic callus bilateral feet   Plan of Care:  1. Patient evaluated.  Comprehensive diabetic foot exam performed today 2.  As a diabetic I stressed the importance of not going barefoot even around the house.  Recommend good supportive shoes and sneakers at all times or house shoes 3.  Continue to work closely with PCP for diabetic  management 4.  Return to clinic as needed     Felecia Shelling, DPM [facility]  Dr. Felecia Shelling, DPM    [address] [place], [place] [address]                Office [phone]  Fax [phone]

## 2023-03-21 ENCOUNTER — Other Ambulatory Visit: Payer: Self-pay | Admitting: Family Medicine

## 2023-03-21 DIAGNOSIS — Z1231 Encounter for screening mammogram for malignant neoplasm of breast: Secondary | ICD-10-CM

## 2023-04-06 ENCOUNTER — Ambulatory Visit
Admission: RE | Admit: 2023-04-06 | Discharge: 2023-04-06 | Disposition: A | Discharge status: Home / Self Care | Source: Ambulatory Visit | Attending: Family Medicine | Admitting: Family Medicine

## 2023-04-06 DIAGNOSIS — Z1231 Encounter for screening mammogram for malignant neoplasm of breast: Secondary | ICD-10-CM | POA: Insufficient documentation

## 2023-12-14 ENCOUNTER — Encounter: Payer: Self-pay | Admitting: Gastroenterology

## 2023-12-25 ENCOUNTER — Ambulatory Visit
Admission: RE | Admit: 2023-12-25 | Discharge: 2023-12-25 | Disposition: A | Discharge status: Home / Self Care | Attending: Gastroenterology | Admitting: Gastroenterology

## 2023-12-25 ENCOUNTER — Encounter
Admission: RE | Disposition: A | Discharge status: Home / Self Care | Payer: Self-pay | Source: Home / Self Care | Attending: Gastroenterology

## 2023-12-25 DIAGNOSIS — Z538 Procedure and treatment not carried out for other reasons: Secondary | ICD-10-CM | POA: Diagnosis not present

## 2023-12-25 DIAGNOSIS — K5900 Constipation, unspecified: Secondary | ICD-10-CM | POA: Insufficient documentation

## 2023-12-25 DIAGNOSIS — Z83719 Family history of colon polyps, unspecified: Secondary | ICD-10-CM | POA: Diagnosis not present

## 2023-12-25 HISTORY — PX: COLONOSCOPY: SHX5424

## 2023-12-25 SURGERY — COLONOSCOPY
Anesthesia: General

## 2023-12-25 MED ORDER — SODIUM CHLORIDE 0.9 % IV SOLN: INTRAVENOUS | Status: DC

## 2023-12-25 NOTE — H&P (Signed)
 Brief GI Care Note  Pt was unable to be fully cleaned out for her procedures.  Stools are still brown.  Will reschedule with additional prep. Office staff notified  Elspeth EMERSON Jungling, DO [facility]

## 2023-12-26 ENCOUNTER — Encounter: Payer: Self-pay | Admitting: Gastroenterology

## 2024-01-23 ENCOUNTER — Ambulatory Visit

## 2024-02-17 IMAGING — MG MM DIGITAL SCREENING BILAT W/ TOMO AND CAD
6 of 10 series · 6 of 30 positions shown · non-contrast
Comparison: Previous exam(s).

CLINICAL DATA: Screening.

EXAM:
DIGITAL SCREENING BILATERAL MAMMOGRAM WITH TOMOSYNTHESIS AND CAD
TECHNIQUE: Bilateral screening digital craniocaudal and mediolateral oblique
mammograms were obtained. Bilateral screening digital breast
tomosynthesis was performed. The images were evaluated with
computer-aided detection.

[R MLO synth-2D]
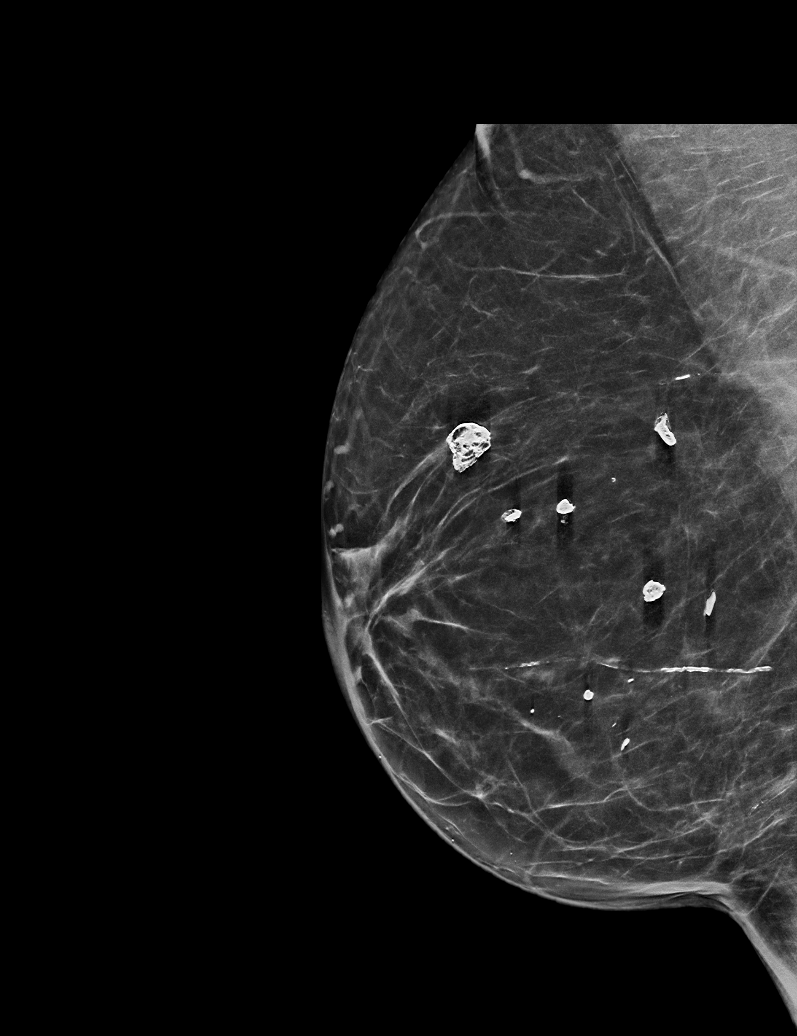

[R CC synth-2D]
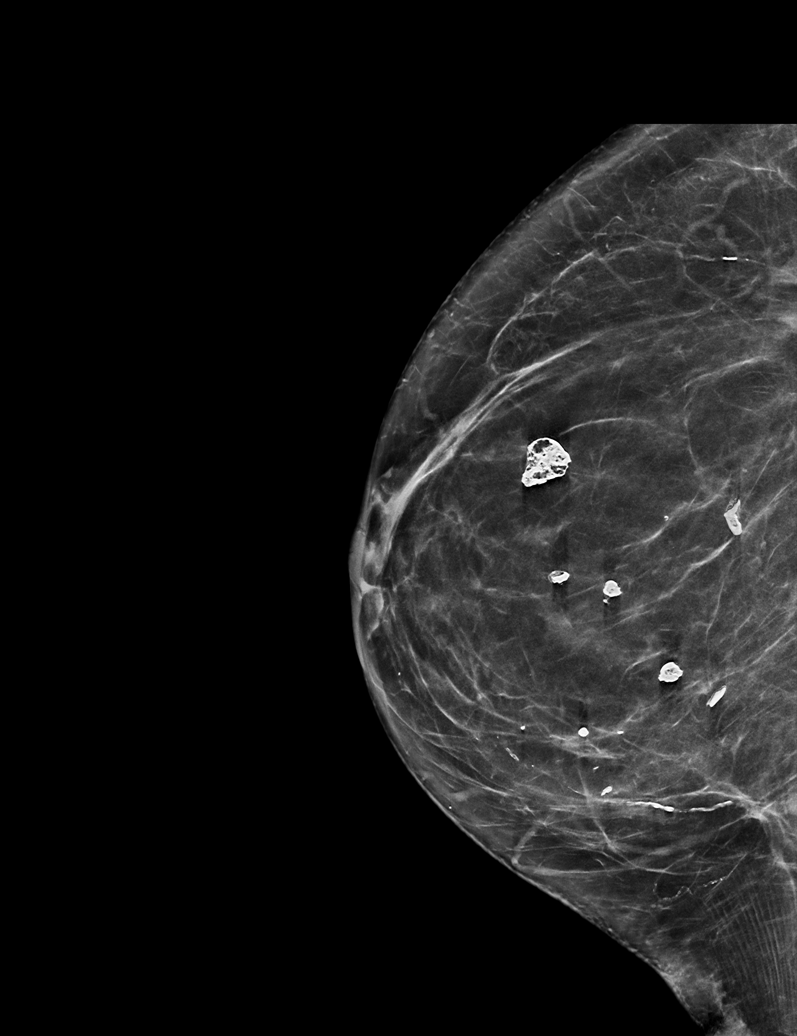

[L MLO synth-2D]
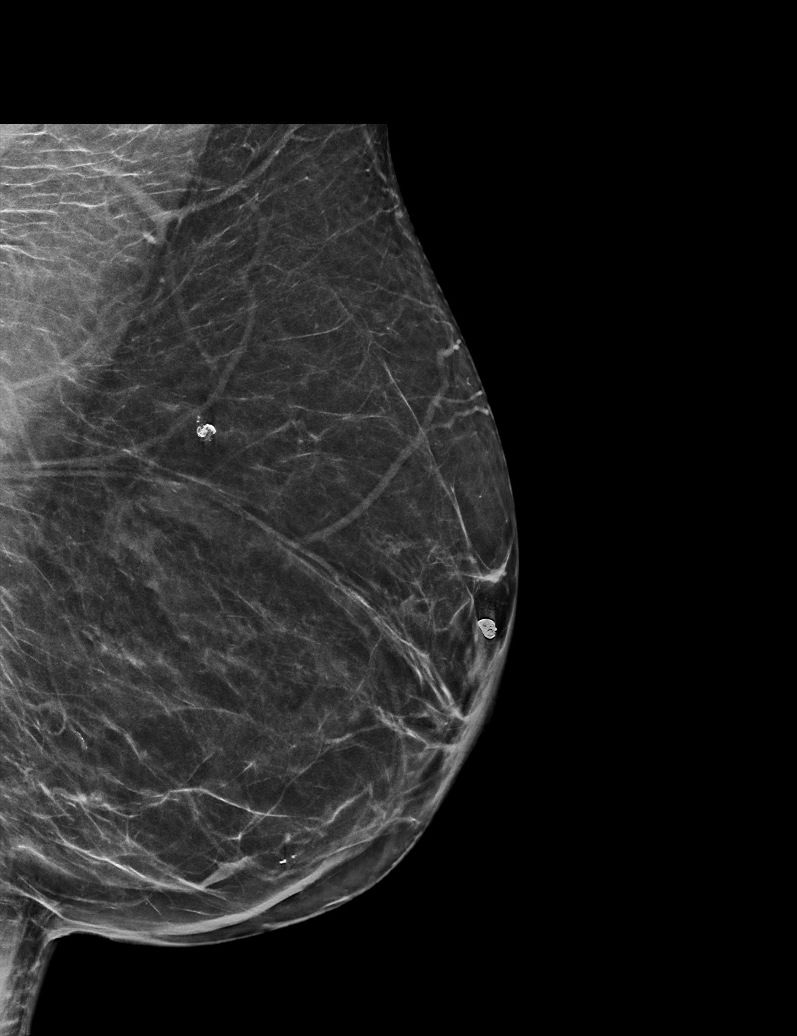

[L XCCL synth-2D]
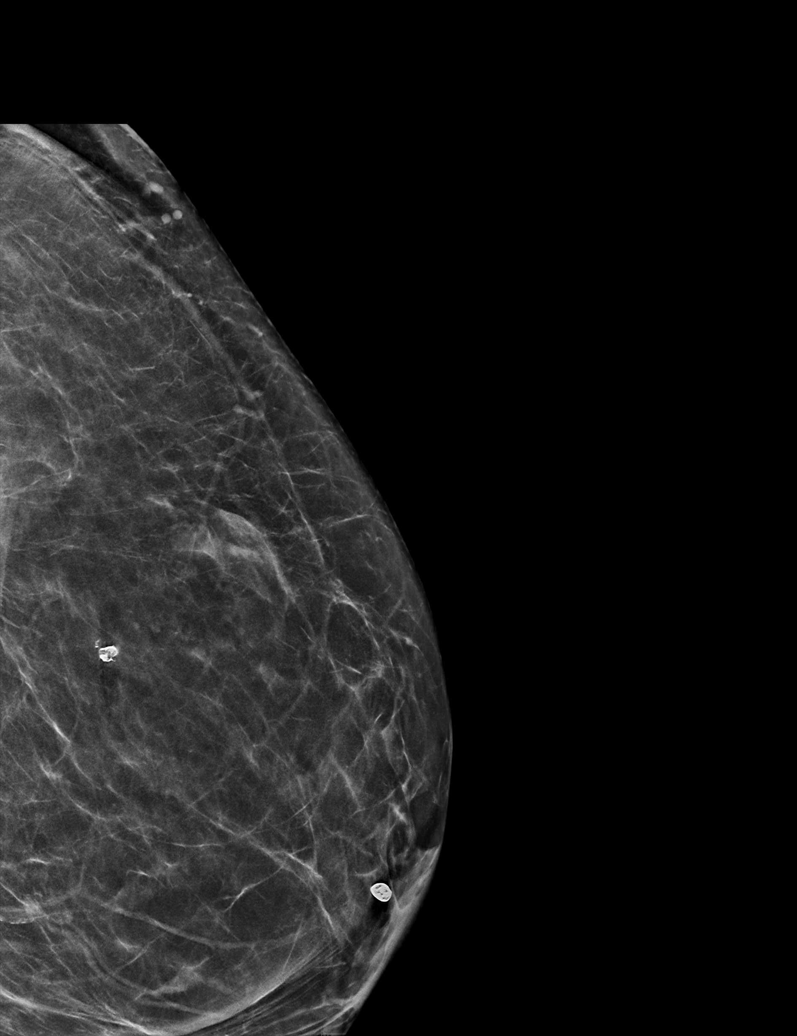

[L CC synth-2D]
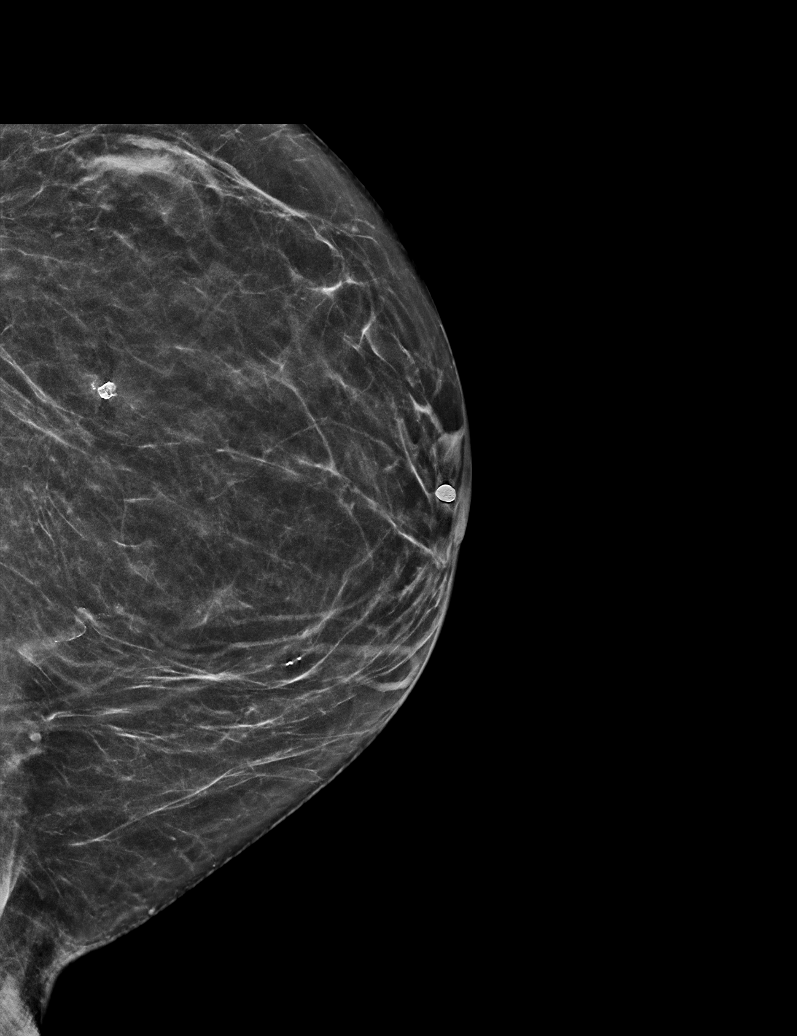

[L CC tomo · tomo slice 29/57.0]
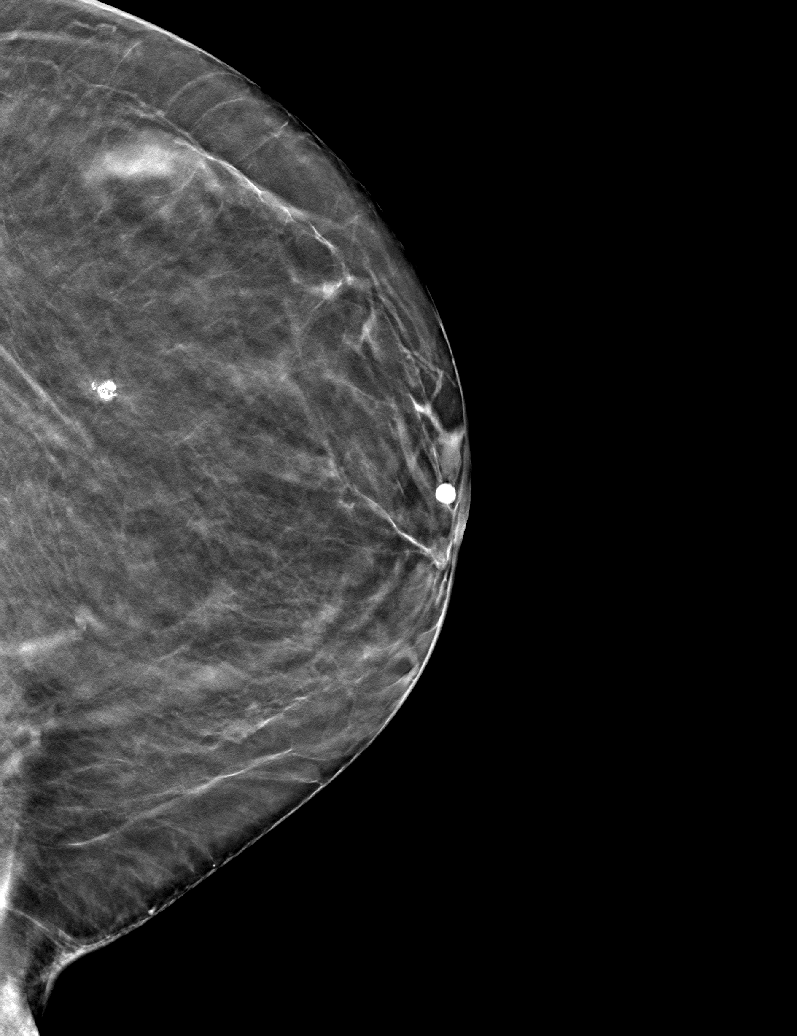

[6 of 30 positions shown; findings below may reference images not displayed]

ACR Breast Density Category b: There are scattered areas of
fibroglandular density.
FINDINGS: There are no findings suspicious for malignancy.
IMPRESSION: No mammographic evidence of malignancy. A result letter of this
screening mammogram will be mailed directly to the patient.

RECOMMENDATION:
Screening mammogram in one year. (Code:51-O-LD2)

BI-RADS CATEGORY  1: Negative.

## 2024-02-28 ENCOUNTER — Ambulatory Visit (INDEPENDENT_AMBULATORY_CARE_PROVIDER_SITE_OTHER)

## 2024-02-28 DIAGNOSIS — Z872 Personal history of diseases of the skin and subcutaneous tissue: Secondary | ICD-10-CM

## 2024-02-28 DIAGNOSIS — D1801 Hemangioma of skin and subcutaneous tissue: Secondary | ICD-10-CM

## 2024-02-28 DIAGNOSIS — Z1283 Encounter for screening for malignant neoplasm of skin: Secondary | ICD-10-CM | POA: Diagnosis not present

## 2024-02-28 DIAGNOSIS — L814 Other melanin hyperpigmentation: Secondary | ICD-10-CM | POA: Diagnosis not present

## 2024-02-28 DIAGNOSIS — L821 Other seborrheic keratosis: Secondary | ICD-10-CM

## 2024-02-28 DIAGNOSIS — L570 Actinic keratosis: Secondary | ICD-10-CM | POA: Diagnosis not present

## 2024-02-28 DIAGNOSIS — W908XXA Exposure to other nonionizing radiation, initial encounter: Secondary | ICD-10-CM | POA: Diagnosis not present

## 2024-02-28 DIAGNOSIS — L820 Inflamed seborrheic keratosis: Secondary | ICD-10-CM

## 2024-02-28 DIAGNOSIS — L578 Other skin changes due to chronic exposure to nonionizing radiation: Secondary | ICD-10-CM

## 2024-02-28 DIAGNOSIS — L905 Scar conditions and fibrosis of skin: Secondary | ICD-10-CM

## 2024-02-28 DIAGNOSIS — D229 Melanocytic nevi, unspecified: Secondary | ICD-10-CM

## 2024-02-28 NOTE — Patient Instructions (Addendum)
 Cryotherapy Aftercare  Wash gently with soap and water everyday.   Apply Vaseline and Band-Aid daily until healed.   Sunscreen  Who needs sunscreen? Everyone. Sunscreen use can help prevent skin cancer by protecting you from the sun's harmful ultraviolet rays. Anyone can get skin cancer, regardless of age, gender or race. In fact, it is estimated that one in five Americans will develop skin cancer in their lifetime.  Sunscreen alone cannot fully protect you. In addition to wearing sunscreen, dermatologists recommend taking the following steps to protect your skin and find skin cancer early:  Seek shade when appropriate, remembering that the sun's rays are strongest between 10 a.m. and 2 p.m. If your shadow is shorter than you are, seek shade. Dress to protect yourself from the sun by wearing a lightweight long-sleeved shirt, pants, a wide-brimmed hat and sunglasses, when possible.  Use extra caution near water, snow and sand as they reflect the damaging rays of the sun, which can increase your chance of sunburn.  Get vitamin D  safely through a healthy diet that may include vitamin supplements. Don't seek the sun. Avoid tanning beds. Ultraviolet light from the sun and tanning beds can cause skin cancer and wrinkling. If you want to look tan, you may wish to use a self-tanning product, but continue to use sunscreen with it.  When should I use sunscreen? Every day you go outside--even if you're just walking to and from your form of transportation. The sun emits harmful UV rays year-round. Even on cloudy days, up to 80 percent of the sun's harmful UV rays can penetrate your skin. Snow, sand and water increase the need for sunscreen because they reflect the sun's rays.  How much sunscreen should I use, and how often should I apply it? Most people only apply 25-50 percent of the recommended amount of sunscreen. Apply enough sunscreen to cover all exposed skin. Most adults need about 1 ounce -- or enough  to fill a shot glass -- to fully cover their body.  Don't forget to apply to the tops of your feet, your neck, your ears and the top of your head. Apply sunscreen to dry skin 15 minutes before going outdoors.  Skin cancer also can form on the lips. To protect your lips, apply a lip balm or lipstick that contains sunscreen with an SPF of 30 or higher.  When outdoors, reapply sunscreen approximately every two hours, or after swimming or sweating, according to the directions on the bottle.   Broad-spectrum sunscreens protect against both UVA and UVB rays. What is the difference between the rays? Sunlight consists of two types of harmful rays that reach the earth -- UVA rays and UVB rays. Overexposure to either can lead to skin cancer. In addition to causing skin cancer, here's what each of these rays do:  UVA rays (or aging rays) can prematurely age your skin, causing wrinkles and age spots, and can pass through window glass. UVB rays (or burning rays) are the primary cause of sunburn and are blocked by window glass  There is no safe way to tan. Every time you tan, you damage your skin. As this damage builds, you speed up the aging of your skin and increase your risk for all types of skin cancer.  What is the difference between chemical and physical sunscreens? Chemical sunscreens work like a sponge, absorbing the sun's rays. They contain one or more of the following active ingredients: oxybenzone, avobenzone, octisalate, octocrylene, homosalate and octinoxate. These formulations tend  to be easier to rub into the skin without leaving a white residue.   Physical sunscreens work like a shield, sitting sit on the surface of your skin and deflecting the sun's rays. They contain the active ingredients zinc oxide and/or titanium dioxide. Use this sunscreen if you have sensitive skin.   What type of sunscreen should I use? The best type of sunscreen is the one you will use again and again. Just make sure it  offers broad-spectrum (UVA and UVB) protection, has an SPF of 30+, and is water-resistant. The kind of sunscreen you use is a matter of personal choice, and may vary depending on the area of the body to be protected. Available sunscreen options include lotions, creams, gels, ointments, wax sticks and sprays.  Recommended physical sunscreens for face: - Neutrogena Sheer Zinc - Aveeno Positively Mineral Sensitive - CeraVe Hydrating Mineral (also has a tinted version) - La Roche-Posay Anthelios Mineral Face (comes as a cream, lotion, light fluid, and there is also a tinted version).  - EltaMD UV Clear (also has a tinted version)  Recommended physical sunscreens for body: - Neutrogena Sheer Zinc Dry-Touch Sunscreen Sensitive Skin Lotion Broad Spectrum SPF 50 - Aveeno Positively Mineral Sensitive Skin Sunscreen Broad Spectrum SPF 50 - La Roche-Posay Anthelios SPF 50 Mineral Sunscreen - Gentle Lotion - CeraVe Hydrating Mineral Sunscreen SPF 50  Recommended chemical sunscreens for face: - Anthelios UV Correct Face Sunscreen SPF 70 with Niacinamide - Neutrogena Clear Face Oil-Free SPF 50 with Helioplex - Neutrogena Sport Face Oil-Free SPF 70+ with Helioplex - Aveeno Protect + Hydrate Sunscreen For Face SPF 70 - La Roche-Posay Anthelios Light Fluid Sunscreen for Face SPF 60  Recommended chemical sunscreens for body: - Neutrogena Ultra Sheer Dry-Touch Sunscreen SPF 70 - Aveeno Protect + Hydrate Broad Spectrum All-Day Hydration SPF 60 (comes in a big pump) - La Roche-Posay Anthelios Melt-In Milk Sunscreen SPF 60   Melanoma ABCDEs  Melanoma is the most dangerous type of skin cancer, and is the leading cause of death from skin disease.  You are more likely to develop melanoma if you: Have light-colored skin, light-colored eyes, or red or blond hair Spend a lot of time in the sun Tan regularly, either outdoors or in a tanning bed Have had blistering sunburns, especially during childhood Have a  close family member who has had a melanoma Have atypical moles or large birthmarks  Early detection of melanoma is key since treatment is typically straightforward and cure rates are extremely high if we catch it early.   The first sign of melanoma is often a change in a mole or a new dark spot.  The ABCDE system is a way of remembering the signs of melanoma.  A for asymmetry:  The two halves do not match. B for border:  The edges of the growth are irregular. C for color:  A mixture of colors are present instead of an even brown color. D for diameter:  Melanomas are usually (but not always) greater than 6mm - the size of a pencil eraser. E for evolution:  The spot keeps changing in size, shape, and color.  Please check your skin once per month between visits. You can use a small mirror in front and a large mirror behind you to keep an eye on the back side or your body.   If you see any new or changing lesions before your next follow-up, please call to schedule a visit.  Please continue daily skin protection including  broad spectrum sunscreen SPF 30+ to sun-exposed areas, reapplying every 2 hours as needed when you're outdoors.     Due to recent changes in healthcare laws, you may see results of your pathology and/or laboratory studies on MyChart before the doctors have had a chance to review them. We understand that in some cases there may be results that are confusing or concerning to you. Please understand that not all results are received at the same time and often the doctors may need to interpret multiple results in order to provide you with the best plan of care or course of treatment. Therefore, we ask that you please give us  2 business days to thoroughly review all your results before contacting the office for clarification. Should we see a critical lab result, you will be contacted sooner.   If You Need Anything After Your Visit  If you have any questions or concerns for your  doctor, please call our main line at [phone] and press option 4 to reach your doctor's medical assistant. If no one answers, please leave a voicemail as directed and we will return your call as soon as possible. Messages left after 4 pm will be answered the following business day.   You may also send us  a message via MyChart. We typically respond to MyChart messages within 1-2 business days.  For prescription refills, please ask your pharmacy to contact our office. Our fax number is [phone].  If you have an urgent issue when the clinic is closed that cannot wait until the next business day, you can page your doctor at the number below.    Please note that while we do our best to be available for urgent issues outside of office hours, we are not available 24/7.   If you have an urgent issue and are unable to reach us , you may choose to seek medical care at your doctor's office, retail clinic, urgent care center, or emergency room.  If you have a medical emergency, please immediately call 911 or go to the emergency department.  Pager Numbers  - Dr. Hester: [phone]  - Dr. Jackquline: [phone]  - Dr. Claudene: [phone]   - Dr. Raymund: [phone]  In the event of inclement weather, please call our main line at [phone] for an update on the status of any delays or closures.  Dermatology Medication Tips: Please keep the boxes that topical medications come in in order to help keep track of the instructions about where and how to use these. Pharmacies typically print the medication instructions only on the boxes and not directly on the medication tubes.   If your medication is too expensive, please contact our office at [phone] option 4 or send us  a message through MyChart.   We are unable to tell what your co-pay for medications will be in advance as this is different depending on your insurance coverage. However, we may be able to find a substitute medication at  lower cost or fill out paperwork to get insurance to cover a needed medication.   If a prior authorization is required to get your medication covered by your insurance company, please allow us  1-2 business days to complete this process.  Drug prices often vary depending on where the prescription is filled and some pharmacies may offer cheaper prices.  The website www.goodrx.com contains coupons for medications through different pharmacies. The prices here do not account for what the cost may be with help from insurance (it may be cheaper with your  insurance), but the website can give you the price if you did not use any insurance.  - You can print the associated coupon and take it with your prescription to the pharmacy.  - You may also stop by our office during regular business hours and pick up a GoodRx coupon card.  - If you need your prescription sent electronically to a different pharmacy, notify our office through [facility] or by phone at [phone] option 4.     Si Usted Necesita Algo Despus de Su Visita  Tambin puede enviarnos un mensaje a travs de Clinical cytogeneticist. Por lo general respondemos a los mensajes de MyChart en el transcurso de 1 a 2 das hbiles.  Para renovar recetas, por favor pida a su farmacia que se ponga en contacto con nuestra oficina. Randi lakes de fax es [place] [phone].  Si tiene un asunto urgente cuando la clnica est cerrada y que no puede esperar hasta el siguiente da hbil, puede llamar/localizar a su doctor(a) al nmero que aparece a continuacin.   Por favor, tenga en cuenta que aunque hacemos todo lo posible para estar disponibles para asuntos urgentes fuera del horario de [place], no estamos disponibles las 24 horas del da, los 7 [address]  [address] de la [place].   Si tiene un problema urgente y no puede comunicarse con nosotros, puede optar por buscar atencin mdica  en el consultorio de su doctor(a), en una clnica privada, en un centro de atencin urgente  o en una sala de emergencias.  Si tiene Engineer, drilling, por favor llame inmediatamente al 911 o vaya a la sala de emergencias.  Nmeros de bper  - Dr. Hester: [phone]  - Dra. Jackquline: [phone]  - Dr. Claudene: [phone]  - Dra. Kitts: [phone]  En caso de inclemencias del Universal, por favor llame a nuestra lnea principal al [phone] para una actualizacin sobre el estado de cualquier retraso o cierre.  Consejos para la medicacin en dermatologa: Por favor, guarde las cajas en las que vienen los medicamentos de uso tpico para ayudarle a seguir las instrucciones sobre dnde y cmo usarlos. Las farmacias generalmente imprimen las instrucciones del medicamento slo en las cajas y no directamente en los tubos del [place].   Si su medicamento es muy caro, por favor, pngase en contacto con landry rieger llamando al [phone] y presione la opcin 4 o envenos un mensaje a travs de Clinical cytogeneticist.   No podemos decirle cul ser su copago por los medicamentos por adelantado ya que esto es diferente dependiendo de la cobertura de su seguro. Sin embargo, es posible que podamos encontrar un medicamento sustituto a Audiological scientist un formulario para que el seguro cubra el medicamento que se considera necesario.   Si se requiere una autorizacin previa para que su compaa de seguros malta su medicamento, por favor permtanos de 1 a 2 das hbiles para completar este proceso.  Los precios de los medicamentos varan con frecuencia dependiendo del Environmental consultant de dnde se surte la receta y alguna farmacias pueden ofrecer precios ms baratos.  El sitio web www.goodrx.com tiene cupones para medicamentos de Health and safety inspector. Los precios aqu no tienen en cuenta lo que podra costar con la ayuda del seguro (puede ser ms barato con su seguro), pero el sitio web puede darle el precio si no utiliz Tourist information centre manager.  - Puede imprimir el cupn correspondiente y llevarlo con su  receta a la farmacia.  - Tambin puede pasar por nuestra oficina durante el horario de atencin regular  y recoger una tarjeta de cupones de GoodRx.  - Si necesita que su receta se enve electrnicamente a una farmacia diferente, informe a nuestra oficina a travs de MyChart de [place] o por telfono llamando al [phone] y presione la opcin 4.

## 2024-02-28 NOTE — Progress Notes (Signed)
 Subjective   Pam Quinn is a 63 y.o. female who presents for the following: Total body skin exam for skin cancer screening and mole check. The patient has spots, moles and lesions to be evaluated, some may be new or changing and the patient may have concern these could be cancer.. Patient is new patient  Today patient reports: Areas of concern on the face.   Review of Systems:    No other skin or systemic complaints except as noted in HPI or Assessment and Plan.  The following portions of the chart were reviewed this encounter and updated as appropriate: medications, allergies, medical history  Relevant Medical History:  Personal history of actinic keratosis  and Family history of skin cancer - mother and father non-melanoma   Objective  Well appearing patient in no apparent distress; mood and affect are within normal limits. Examination was performed of the: Full Skin Examination: scalp, head, eyes, ears, nose, lips, neck, chest, axillae, abdomen, back, buttocks, bilateral upper extremities, bilateral lower extremities, hands, feet, fingers, toes, fingernails, and toenails.   Examination notable for: SKIN EXAM, Angioma(s): Scattered red vascular papule(s)  , Lentigo/lentigines: Scattered pigmented macules that are tan to brown in color and are somewhat non-uniform in shape and concentrated in the sun-exposed areas, Nevus/nevi: Scattered well-demarcated, regular, pigmented macule(s) and/or papule(s)  , Seborrheic Keratosis(es): Stuck-on appearing keratotic papule(s) on the trunk, none  irritated with redness, crusting, edema, and/or partial avulsion, Actinic Damage/Elastosis: chronic sun damage: dyspigmentation, telangiectasia, and wrinkling, Actinic keratosis: Scaly erythematous macule(s) concentrated on sun exposed areas   Examination limited by: Undergarments and Patient deferred removal     Left cheek x1 Pink scaly macules  Assessment & Plan   SKIN CANCER SCREENING PERFORMED  TODAY.  BENIGN SKIN FINDINGS  - Lentigines  - Seborrheic keratoses  - Hemangiomas   - Nevus/Multiple Benign Nevi  - Well healed scar x2 from breast reduction surgery  - Reassurance provided regarding the benign appearance of lesions noted on exam today; no treatment is indicated in the absence of symptoms/changes. - Reinforced importance of photoprotective strategies including liberal and frequent sunscreen use of a broad-spectrum SPF 30 or greater, use of protective clothing, and sun avoidance for prevention of cutaneous malignancy and photoaging.  Counseled patient on the importance of regular self-skin monitoring as well as routine clinical skin examinations as scheduled.   ACTINIC DAMAGE - Chronic condition, secondary to cumulative UV/sun exposure - Recommend daily broad spectrum sunscreen SPF 30+ to sun-exposed areas, reapply every 2 hours as needed.  - Staying in the shade or wearing long sleeves, sun glasses (UVA+UVB protection) and wide brim hats (4-inch brim around the entire circumference of the hat) are also recommended for sun protection.  - Call for new or changing lesions.  Personal history of actinic keratosis  - Reviewed medical history for full details  - Reviewed sun protective measures as above - Encouraged full body skin exams     Level of service outlined above   Procedures, orders, diagnosis for this visit:  ACTINIC KERATOSIS Left cheek x1 Actinic keratoses are precancerous spots that appear secondary to cumulative UV radiation exposure/sun exposure over time. They are chronic with expected duration over 1 year. A portion of actinic keratoses will progress to squamous cell carcinoma of the skin. It is not possible to reliably predict which spots will progress to skin cancer and so treatment is recommended to prevent development of skin cancer.  Recommend daily broad spectrum sunscreen SPF 30+ to  sun-exposed areas, reapply every 2 hours as needed.  Recommend staying  in the shade or wearing long sleeves, sun glasses (UVA+UVB protection) and wide brim hats (4-inch brim around the entire circumference of the hat). Call for new or changing lesions. Destruction of lesion - Left cheek x1 Complexity: simple   Destruction method: cryotherapy   Informed consent: discussed and consent obtained   Timeout:  patient name, date of birth, surgical site, and procedure verified Lesion destroyed using liquid nitrogen: Yes   Region frozen until ice ball extended beyond lesion: Yes   Cryo cycles: 1 or 2. Outcome: patient tolerated procedure well with no complications   Post-procedure details: wound care instructions given     Actinic keratosis -     Destruction of lesion    Return to clinic: Return in about 1 year (around 02/27/2025) for TBSE.  Documentation: I, Emerick Ege, CMA am acting as scribe for Lauraine JAYSON Kanaris, MD.   I have reviewed the above documentation for accuracy and completeness, and I agree with the above.  Lauraine JAYSON Kanaris, MD

## 2025-02-27 ENCOUNTER — Ambulatory Visit
